# Patient Record
Sex: Male | Born: 1975 | Race: Black or African American | Hispanic: No | Marital: Single | Smoking: Never smoker
Health system: Southern US, Community
[De-identification: ages and names within clinical notes are randomized; demographics above are authoritative.]

## PROBLEM LIST (undated history)

## (undated) DIAGNOSIS — G43009 Migraine without aura, not intractable, without status migrainosus: Secondary | ICD-10-CM

## (undated) DIAGNOSIS — F411 Generalized anxiety disorder: Secondary | ICD-10-CM

## (undated) DIAGNOSIS — J309 Allergic rhinitis, unspecified: Secondary | ICD-10-CM

## (undated) DIAGNOSIS — Z87898 Personal history of other specified conditions: Secondary | ICD-10-CM

## (undated) DIAGNOSIS — E785 Hyperlipidemia, unspecified: Secondary | ICD-10-CM

## (undated) HISTORY — DX: Personal history of other specified conditions: Z87.898

## (undated) HISTORY — DX: Allergic rhinitis, unspecified: J30.9

## (undated) HISTORY — DX: Migraine without aura, not intractable, without status migrainosus: G43.009

## (undated) HISTORY — DX: Morbid (severe) obesity due to excess calories: E66.01

## (undated) HISTORY — DX: Generalized anxiety disorder: F41.1

## (undated) HISTORY — DX: Hyperlipidemia, unspecified: E78.5

---

## 2002-06-09 ENCOUNTER — Emergency Department (HOSPITAL_COMMUNITY): Admission: EM | Admit: 2002-06-09 | Discharge: 2002-06-09 | Payer: Self-pay | Admitting: Emergency Medicine

## 2005-11-20 ENCOUNTER — Ambulatory Visit: Payer: Self-pay | Admitting: Internal Medicine

## 2005-12-13 ENCOUNTER — Ambulatory Visit: Payer: Self-pay | Admitting: *Deleted

## 2005-12-13 ENCOUNTER — Encounter (HOSPITAL_COMMUNITY): Admission: RE | Admit: 2005-12-13 | Discharge: 2006-03-13 | Payer: Self-pay | Admitting: Internal Medicine

## 2006-01-31 ENCOUNTER — Ambulatory Visit: Payer: Self-pay | Admitting: Cardiology

## 2006-02-12 ENCOUNTER — Encounter: Payer: Self-pay | Admitting: Cardiovascular Disease

## 2006-02-12 ENCOUNTER — Ambulatory Visit: Payer: Self-pay

## 2006-09-11 ENCOUNTER — Ambulatory Visit: Payer: Self-pay | Admitting: Internal Medicine

## 2007-02-14 ENCOUNTER — Encounter: Admission: RE | Admit: 2007-02-14 | Discharge: 2007-02-14 | Payer: Self-pay | Admitting: Occupational Medicine

## 2007-09-10 ENCOUNTER — Ambulatory Visit: Payer: Self-pay | Admitting: Internal Medicine

## 2007-10-09 ENCOUNTER — Ambulatory Visit: Payer: Self-pay | Admitting: Internal Medicine

## 2007-10-09 LAB — CONVERTED CEMR LAB
AST: 19 units/L (ref 0–37)
Albumin: 3.6 g/dL (ref 3.5–5.2)
BUN: 6 mg/dL (ref 6–23)
Basophils Absolute: 0.1 10*3/uL (ref 0.0–0.1)
Bilirubin Urine: NEGATIVE
Calcium: 9.2 mg/dL (ref 8.4–10.5)
Chloride: 107 meq/L (ref 96–112)
Cholesterol: 169 mg/dL (ref 0–200)
Eosinophils Absolute: 0.1 10*3/uL (ref 0.0–0.6)
GFR calc non Af Amer: 165 mL/min
HDL: 52.7 mg/dL (ref >39.0)
Hemoglobin, Urine: NEGATIVE
Hgb A1c MFr Bld: 5.9 % (ref 4.6–6.0)
Ketones, ur: NEGATIVE mg/dL
LDL Cholesterol: 110 mg/dL — ABNORMAL HIGH (ref 0–99)
MCHC: 34.3 g/dL (ref 30.0–36.0)
MCV: 86.6 fL (ref 78.0–100.0)
Monocytes Relative: 10.2 % (ref 3.0–11.0)
Neutrophils Relative %: 54 % (ref 43.0–77.0)
Platelets: 253 10*3/uL (ref 150–400)
RBC: 4.43 M/uL (ref 4.22–5.81)
Sodium: 141 meq/L (ref 135–145)
Total CHOL/HDL Ratio: 3.2
Urine Glucose: NEGATIVE mg/dL
VLDL: 6 mg/dL (ref 0–40)

## 2007-10-14 DIAGNOSIS — Z87898 Personal history of other specified conditions: Secondary | ICD-10-CM

## 2007-10-14 DIAGNOSIS — Z9189 Other specified personal risk factors, not elsewhere classified: Secondary | ICD-10-CM | POA: Insufficient documentation

## 2007-10-14 HISTORY — DX: Personal history of other specified conditions: Z87.898

## 2007-10-15 ENCOUNTER — Ambulatory Visit: Payer: Self-pay | Admitting: Internal Medicine

## 2008-01-08 ENCOUNTER — Ambulatory Visit: Payer: Self-pay | Admitting: Internal Medicine

## 2008-05-06 ENCOUNTER — Ambulatory Visit: Payer: Self-pay | Admitting: Internal Medicine

## 2008-05-06 ENCOUNTER — Telehealth: Payer: Self-pay | Admitting: Internal Medicine

## 2008-05-06 DIAGNOSIS — M25579 Pain in unspecified ankle and joints of unspecified foot: Secondary | ICD-10-CM

## 2008-11-22 ENCOUNTER — Ambulatory Visit: Payer: Self-pay | Admitting: Internal Medicine

## 2008-11-22 DIAGNOSIS — J019 Acute sinusitis, unspecified: Secondary | ICD-10-CM | POA: Insufficient documentation

## 2008-11-22 LAB — CONVERTED CEMR LAB
Basophils Absolute: 0 10*3/uL (ref 0.0–0.1)
Bilirubin Urine: NEGATIVE
Bilirubin, Direct: 0.2 mg/dL (ref 0.0–0.3)
Calcium: 9.5 mg/dL (ref 8.4–10.5)
GFR calc Af Amer: 166 mL/min
HCT: 42.6 % (ref 39.0–52.0)
HDL: 54 mg/dL (ref >39.0)
Hemoglobin: 14.4 g/dL (ref 13.0–17.0)
Leukocytes, UA: NEGATIVE
MCHC: 33.9 g/dL (ref 30.0–36.0)
MCV: 88.4 fL (ref 78.0–100.0)
Monocytes Absolute: 0.9 10*3/uL (ref 0.1–1.0)
Neutro Abs: 5.8 10*3/uL (ref 1.4–7.7)
RDW: 13 % (ref 11.5–14.6)
Sodium: 137 meq/L (ref 135–145)
TSH: 3.78 microintl units/mL (ref 0.35–5.50)
Total Bilirubin: 1.4 mg/dL — ABNORMAL HIGH (ref 0.3–1.2)
Total CHOL/HDL Ratio: 3.2
Total Protein: 8.5 g/dL — ABNORMAL HIGH (ref 6.0–8.3)
Triglycerides: 42 mg/dL (ref 0–149)
pH: 7 (ref 5.0–8.0)

## 2010-01-19 ENCOUNTER — Ambulatory Visit: Payer: Self-pay | Admitting: Internal Medicine

## 2010-01-19 ENCOUNTER — Encounter (INDEPENDENT_AMBULATORY_CARE_PROVIDER_SITE_OTHER): Payer: Self-pay | Admitting: *Deleted

## 2010-01-20 LAB — CONVERTED CEMR LAB
ALT: 27 units/L (ref 0–53)
AST: 24 units/L (ref 0–37)
Albumin: 3.8 g/dL (ref 3.5–5.2)
Basophils Absolute: 0 10*3/uL (ref 0.0–0.1)
Basophils Relative: 0.4 % (ref 0.0–3.0)
CO2: 30 meq/L (ref 19–32)
Cholesterol: 167 mg/dL (ref 0–200)
GFR calc non Af Amer: 196.57 mL/min (ref >60)
Glucose, Bld: 89 mg/dL (ref 70–99)
HCT: 41.3 % (ref 39.0–52.0)
Hemoglobin: 13.5 g/dL (ref 13.0–17.0)
Leukocytes, UA: NEGATIVE
Lymphs Abs: 1.8 10*3/uL (ref 0.7–4.0)
Monocytes Relative: 16.5 % — ABNORMAL HIGH (ref 3.0–12.0)
Neutro Abs: 0.8 10*3/uL — ABNORMAL LOW (ref 1.4–7.7)
Nitrite: NEGATIVE
Potassium: 4.1 meq/L (ref 3.5–5.1)
RBC: 4.59 M/uL (ref 4.22–5.81)
RDW: 13.1 % (ref 11.5–14.6)
Sodium: 138 meq/L (ref 135–145)
Specific Gravity, Urine: 1.025 (ref 1.000–1.030)
TSH: 2.93 microintl units/mL (ref 0.35–5.50)
Total Protein, Urine: NEGATIVE mg/dL
Total Protein: 8.4 g/dL — ABNORMAL HIGH (ref 6.0–8.3)
VLDL: 13.4 mg/dL (ref 0.0–40.0)
pH: 6 (ref 5.0–8.0)

## 2010-04-26 ENCOUNTER — Ambulatory Visit: Payer: Self-pay | Admitting: Internal Medicine

## 2010-04-26 DIAGNOSIS — J309 Allergic rhinitis, unspecified: Secondary | ICD-10-CM

## 2010-04-26 DIAGNOSIS — G43009 Migraine without aura, not intractable, without status migrainosus: Secondary | ICD-10-CM | POA: Insufficient documentation

## 2010-04-26 HISTORY — DX: Migraine without aura, not intractable, without status migrainosus: G43.009

## 2010-04-26 HISTORY — DX: Allergic rhinitis, unspecified: J30.9

## 2011-01-06 ENCOUNTER — Encounter: Payer: Self-pay | Admitting: Internal Medicine

## 2011-01-18 NOTE — Letter (Signed)
Summary: Out of Work  [facility]  [address] [place], [place] [address]   Phone: [phone]  Fax: [phone]    January 19, 2010   Employee:  ANUSH WIEDEMAN    To Whom It May Concern:   For Medical reasons, please excuse the above named employee from work for the following dates:  Start:   01/19/2010  End:   01/22/2010  If you need additional information, please feel free to contact our office.         Sincerely,    Dr Oliver Barre

## 2011-01-18 NOTE — Letter (Signed)
Summary: Out of Work  [facility]  [address] [place], [place] [address]   Phone: [phone]  Fax: [phone]    Apr 26, 2010   Employee:  FAIZAN GERACI Wernli    To Whom It May Concern:   For Medical reasons, please excuse the above named employee from work for the following dates:  Start:   Apr 25, 2010  End:   Apr 26, 2010   -  to return to work Apr 27, 2010  If you need additional information, please feel free to contact our office.         Sincerely,    Corwin Levins MD

## 2011-01-18 NOTE — Assessment & Plan Note (Signed)
Summary: headaches/cd   Vital Signs:  Patient profile:   35 year old male Height:      70 inches Weight:      453 pounds BMI:     65.23 O2 Sat:      98 % on Room air Temp:     97.7 degrees F oral Pulse rate:   70 / minute BP sitting:   108 / 70  (left arm) Cuff size:   large  Vitals Entered By: Bill Salinas CMA (Apr 26, 2010 2:10 PM)  O2 Flow:  Room air CC: pt here with complaint of headaches with sensitivity to light and sound/ ab   Primary Care Provider:  Corwin Levins MD  CC:  pt here with complaint of headaches with sensitivity to light and sound/ ab.  History of Present Illness: here with onset x 2 yrs recurrening headaches describes as usually left sided, throbbing, with nuausea and photophobia, lasting several hours per episode, and usually better with execdring migraine.  Similar to headache he used to have as teen.  This wk had another headache Monday that did not seem to be more severe (but still only mod), but also with 3 days low grade fever, ST, general weakness, but no cough and Pt denies CP, sob, doe, wheezing, orthopnea, pnd, worsening LE edema, palps, dizziness or syncope   Pt denies new neuro symptoms such as headache, facial or extremity weakness  Also has 1-2 mo nasal allergy symtpom with clearish d/c .    Preventive Screening-Counseling & Management      Drug Use:  no.    Problems Prior to Update: 1)  Allergic Rhinitis  (ICD-477.9) 2)  Common Migraine  (ICD-346.10) 3)  Pharyngitis-acute  (ICD-462) 4)  Sinusitis- Acute-nos  (ICD-461.9) 5)  Preventive Health Care  (ICD-V70.0) 6)  Ankle Pain, Left  (ICD-719.47) 7)  Morbid Obesity, Hx of  (ICD-V13.8) 8)  Chest Pain, Atypical, Hx of  (ICD-V15.89)  Medications Prior to Update: 1)  Clarithromycin 500 Mg Tabs (Clarithromycin) .Marland Kitchen.. 1po Two Times A Day  Current Medications (verified): 1)  Sumatriptan Succinate 100 Mg Tabs (Sumatriptan Succinate) .Marland Kitchen.. 1 By Mouth Once Daily As Needed Migraine 2)  Azithromycin 250  Mg Tabs (Azithromycin) .... 2po Qd For 1 Day, Then 1po Qd For 4days, Then Stop 3)  Fexofenadine Hcl 180 Mg Tabs (Fexofenadine Hcl) .Marland Kitchen.. 1 By Mouth Once Daily As Needed Allergies  Allergies (verified): No Known Drug Allergies  Past History:  Past Surgical History: Last updated: 11/22/2008 Denies surgical history  Social History: Last updated: 04/26/2010 He does not drink or smoke.  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 He does not drink or smoke.  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  , mild ill  Head:  normocephalic and atraumatic.   Eyes:  vision grossly intact, pupils equal, and pupils round.   Ears:  bilat tm's mild red, sinus nontender Nose:  nasal dischargemucosal pallor and mucosal edema.   Mouth:  pharyngeal erythema and fair dentition.   Neck:  supple and cervical lymphadenopathy.   Lungs:  normal  respiratory effort and normal breath sounds.   Heart:  normal rate and regular rhythm.   Extremities:  no edema, no erythema  Neurologic:  cranial nerves II-XII intact, strength normal in all extremities, gait normal, and DTRs symmetrical and normal.     Impression & Recommendations:  Problem # 1:  COMMON MIGRAINE (ICD-346.10)  His updated medication list for this problem includes:    Sumatriptan Succinate 100 Mg Tabs (Sumatriptan succinate) .Marland Kitchen... 1 by mouth once daily as needed migraine treat as above, f/u any worsening signs or symptoms   Problem # 2:  PHARYNGITIS-ACUTE (ICD-462)  The following medications were removed from the medication list:     Clarithromycin 500 Mg Tabs (Clarithromycin) .Marland Kitchen... 1po two times a day His updated medication list for this problem includes:    Azithromycin 250 Mg Tabs (Azithromycin) .Marland Kitchen... 2po qd for 1 day, then 1po qd for 4days, then stop treat as above, f/u any worsening signs or symptoms   Problem # 3:  ALLERGIC RHINITIS (ICD-477.9)  His updated medication list for this problem includes:    Fexofenadine Hcl 180 Mg Tabs (Fexofenadine hcl) .Marland Kitchen... 1 by mouth once daily as needed allergies treat as above, f/u any worsening signs or symptoms   Complete Medication List: 1)  Sumatriptan Succinate 100 Mg Tabs (Sumatriptan succinate) .Marland Kitchen.. 1 by mouth once daily as needed migraine 2)  Azithromycin 250 Mg Tabs (Azithromycin) .... 2po qd for 1 day, then 1po qd for 4days, then stop 3)  Fexofenadine Hcl 180 Mg Tabs (Fexofenadine hcl) .Marland Kitchen.. 1 by mouth once daily as needed allergies  Patient Instructions: 1)  Please take all new medications as prescribed 2)  Continue all previous medications as before this visit  3)  you can also take Excedrin Migraine for the milder headaches 4)  Please call if the headaches become more frequent or severe, as you may want to be [facility] 5)  Please schedule a follow-up appointment as needed. Prescriptions: FEXOFENADINE HCL 180 MG TABS (FEXOFENADINE HCL) 1 by mouth once daily as needed allergies  #30 x 11   Entered and Authorized by:   Corwin Levins MD   Signed by:   Corwin Levins MD on 04/26/2010   Method used:   Print then Give to Patient   RxID:   [id number] AZITHROMYCIN 250 MG TABS (AZITHROMYCIN) 2po qd for 1 day, then 1po qd for 4days, then stop  #6 x 1   Entered and Authorized by:   Corwin Levins MD   Signed by:   Corwin Levins MD on 04/26/2010   Method used:   Print then Give to Patient   RxID:   [id number] SUMATRIPTAN SUCCINATE 100 MG TABS (SUMATRIPTAN SUCCINATE) 1 by mouth once daily as needed migraine  #9 x 11   Entered and  Authorized by:   Corwin Levins MD   Signed by:   Corwin Levins MD on 04/26/2010   Method used:   Print then Give to Patient   RxID:   [id number]

## 2011-01-18 NOTE — Assessment & Plan Note (Signed)
Summary: COLD/ CONGESTION/ COUGH/NWS   Vital Signs:  Patient profile:   35 year old male Height:      70 inches Weight:      459 pounds BMI:     66.10 O2 Sat:      98 % on Room air Temp:     98 degrees F oral Pulse rate:   75 / minute BP sitting:   112 / 82  (left arm) Cuff size:   large  Vitals Entered ByMarland Kitchen Zella Ball Ewing (January 19, 2010 2:04 PM)  O2 Flow:  Room air  CC: congestion,cough, sneeezing/RE   Primary Care Provider:  Corwin Levins MD  CC:  congestion, cough, and sneeezing/RE.  History of Present Illness: here for wellness, incidently with 3 days facial pain, pressure, fever and greenish d/c;  some mild ST, but Pt denies CP, sob, doe, wheezing, orthopnea, pnd, worsening LE edema, palps, dizziness or syncope   Pt denies new neuro symptoms such as headache, facial or extremity weakness     Problems Prior to Update: 1)  Sinusitis- Acute-nos  (ICD-461.9) 2)  Preventive Health Care  (ICD-V70.0) 3)  Ankle Pain, Left  (ICD-719.47) 4)  Morbid Obesity, Hx of  (ICD-V13.8) 5)  Chest Pain, Atypical, Hx of  (ICD-V15.89)  Medications Prior to Update: 1)  Azithromycin 250 Mg Tabs (Azithromycin) .... 2po Qd For 1 Day, Then 1po Qd For 4days, Then Stop  Current Medications (verified): 1)  Clarithromycin 500 Mg Tabs (Clarithromycin) .Marland Kitchen.. 1po Two Times A Day  Allergies (verified): No Known Drug Allergies  Past History:  Past Medical History: Last updated: 05/06/2008 Morbid obesity  Past Surgical History: Last updated: 11/22/2008 Denies surgical history  Family History: Last updated: 05/06/2008 Mother is age 46, has coronary artery disease.  Father is age 16, has hyperlipidemia and hypertension, status post coronary artery bypass grafting.  There is a family history of diabetes in his grandparents and aunts and uncles.  Mother is also morbidly obese, as well as other extended family members.  Patient recently had a brother that is age 67 die of sudden death.  Death  certificate noted possible heart disease.  Patient has 2 other brothers in relatively good health and one sister known to have some manner of thyroid disease.  Social History: Last updated: 11/22/2008 He does not drink or smoke.  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         all otherwise negative per pt   Physical Exam  General:  alert and overweight-appearing.  , mild ill  Head:  normocephalic and atraumatic.   Eyes:  vision grossly intact, pupils equal, and pupils round.   Ears:  bilat tm's red , sinus tender bilat Nose:  nasal dischargemucosal pallor and mucosal erythema.   Mouth:  pharyngeal erythema and fair dentition.   Neck:  supple and no masses.   Lungs:  normal respiratory effort and normal breath sounds.   Heart:  normal rate and regular rhythm.   Abdomen:  soft, non-tender, and normal bowel sounds.   Msk:  no joint tenderness and no joint swelling.   Extremities:  no edema, no erythema  Neurologic:  cranial nerves II-XII intact and strength normal in all extremities.     Impression & Recommendations:  Problem # 1:  Preventive Health Care (ICD-V70.0)  Overall doing well, age appropriate education and counseling updated and referral for appropriate preventive services done unless declined, immunizations up to date or declined, diet counseling done if overweight, urged to quit smoking if smokes , most recent labs reviewed and current ordered if  appropriate, ecg reviewed or declined (interpretation per ECG scanned in the EMR if done); information regarding Medicare Prevention requirements given if appropriate   Orders: TLB-BMP (Basic Metabolic Panel-BMET) (80048-METABOL) TLB-CBC Platelet - w/Differential (85025-CBCD) TLB-Hepatic/Liver Function Pnl (80076-HEPATIC) TLB-Lipid Panel (80061-LIPID) TLB-TSH (Thyroid Stimulating Hormone) (84443-TSH) TLB-Udip ONLY (81003-UDIP) Prescription Created Electronically [phone])  Problem # 2:  SINUSITIS- ACUTE-NOS (ICD-461.9)  His updated medication list for this problem includes:    Clarithromycin 500 Mg Tabs (Clarithromycin) .Marland Kitchen... 1po two times a day treat as above, f/u any worsening signs or symptoms   Complete Medication List: 1)  Clarithromycin 500 Mg Tabs (Clarithromycin) .Marland Kitchen.. 1po two times a day  Other Orders: Tdap => 11yrs IM (98119) Admin 1st Vaccine (14782)  Patient Instructions: 1)  you had the tetanus shot today 2)  Please take all new medications as prescribed 3)  Continue all previous medications as before this visit 4)  You can also use Mucinex D OTC or it's generic for congestion  5)  Please go to the Lab in the basement for your blood and/or urine tests today 6)  You are given the work note today 7)  Please schedule a follow-up appointment in 1 year or sooner if needed Prescriptions: CLARITHROMYCIN 500 MG TABS (CLARITHROMYCIN) 1po two times a day  #20 x 0   Entered and Authorized by:   Corwin Levins MD   Signed by:   Corwin Levins MD on 01/19/2010   Method used:   Print then Give to Patient   RxID:   [id number]    Immunizations Administered:  Tetanus Vaccine:    Vaccine Type: Tdap    Site: right deltoid    Mfr: GlaxoSmithKline    Dose: 0.5 ml    Route: IM    Given by: Robin Ewing    Exp. Date: 02/11/2012    Lot #: [id number]    VIS given: 11/04/07 version given January 19, 2010.

## 2011-01-18 NOTE — Letter (Signed)
Summary: Out of Work  [facility]  [address] [place], [place] [address]   Phone: [phone]  Fax: [phone]    January 19, 2010   Employee:  Francisco Hoffman    To Whom It May Concern:   For Medical reasons, please excuse the above named employee from work for the following dates:  Start: 01/16/2010    End: 01/23/2010    If you need additional information, please feel free to contact our office.         Sincerely,    Dr Oliver Barre

## 2011-02-12 ENCOUNTER — Ambulatory Visit: Payer: Self-pay | Admitting: Internal Medicine

## 2011-02-22 ENCOUNTER — Encounter: Payer: Self-pay | Admitting: Internal Medicine

## 2011-02-22 ENCOUNTER — Ambulatory Visit (INDEPENDENT_AMBULATORY_CARE_PROVIDER_SITE_OTHER): Payer: BC Managed Care – PPO | Admitting: Internal Medicine

## 2011-02-22 DIAGNOSIS — A088 Other specified intestinal infections: Secondary | ICD-10-CM | POA: Insufficient documentation

## 2011-02-22 DIAGNOSIS — G43009 Migraine without aura, not intractable, without status migrainosus: Secondary | ICD-10-CM

## 2011-02-22 DIAGNOSIS — F411 Generalized anxiety disorder: Secondary | ICD-10-CM

## 2011-02-22 HISTORY — DX: Generalized anxiety disorder: F41.1

## 2011-02-27 NOTE — Letter (Signed)
Summary: Out of Work  [facility]  [address] [place], [place] [address]   Phone: [phone]  Fax: [phone]    February 22, 2011   Employee:  REBEKAH SPRINKLE    To Whom It May Concern:   For Medical reasons, please excuse the above named employee from work for the following dates:  Start:   Feb 21, 2011  End:   Feb 25, 2011    -  to return to work Feb 26, 2011 without restriction  If you need additional information, please feel free to contact our office.         Sincerely,    Corwin Levins MD

## 2011-02-27 NOTE — Assessment & Plan Note (Signed)
Summary: diarrhea and vomitting for 2 days/lb   Vital Signs:  Patient profile:   35 year old male Height:      70 inches Weight:      435.25 pounds BMI:     62.68 O2 Sat:      97 % on Room air Temp:     98.5 degrees F oral Pulse rate:   81 / minute BP sitting:   118 / 76  (left arm) Cuff size:   large  Vitals Entered By: Margaret Pyle, CMA (February 22, 2011 4:52 PM)  O2 Flow:  Room air CC: abd pain, nausea and diarrhea x 2 days   Primary Care Provider:  Corwin Levins MD  CC:  abd pain and nausea and diarrhea x 2 days.  History of Present Illness: here to f/u with symtpoms of fever, general weakness and malaise, n/v crampy abd pains and mult loose stools without blood over the past 2 days;  Pt denies CP, worsening sob, doe, wheezing, orthopnea, pnd, worsening LE edema, palps, dizziness or syncope  No gu symtpoms such as dysuria, freq, urgency. No recent wt loss, night sweats, loss of appetite or other constitutional symptoms . 2 other family members ill with the same symptoms symtoms now resolved.  Pt denies polydipsia, polyuria,  Overall good compliance with meds, trying to follow low chol, DM diet, wt stable, little excercise however .  Denies worsening depressive symptoms, suicidal ideation, or panic, though has some ongoing anxiety, though not worse recently.  Did miss work for 3 days in late feb due to migraine, but this is very uncommon recently,  declines need for change in tx , or trial SSRI or ther anxiety tx , or counseling  Problems Prior to Update: 1)  Viral Gastroenteritis  (ICD-008.8) 2)  Allergic Rhinitis  (ICD-477.9) 3)  Common Migraine  (ICD-346.10) 4)  Sinusitis- Acute-nos  (ICD-461.9) 5)  Preventive Health Care  (ICD-V70.0) 6)  Ankle Pain, Left  (ICD-719.47) 7)  Morbid Obesity, Hx of  (ICD-V13.8) 8)  Chest Pain, Atypical, Hx of  (ICD-V15.89)  Medications Prior to Update: 1)  Sumatriptan Succinate 100 Mg Tabs (Sumatriptan Succinate) .Marland Kitchen.. 1 By Mouth Once  Daily As Needed Migraine 2)  Azithromycin 250 Mg Tabs (Azithromycin) .... 2po Qd For 1 Day, Then 1po Qd For 4days, Then Stop 3)  Fexofenadine Hcl 180 Mg Tabs (Fexofenadine Hcl) .Marland Kitchen.. 1 By Mouth Once Daily As Needed Allergies  Current Medications (verified): 1)  Sumatriptan Succinate 100 Mg Tabs (Sumatriptan Succinate) .Marland Kitchen.. 1 By Mouth Once Daily As Needed Migraine 2)  Fexofenadine Hcl 180 Mg Tabs (Fexofenadine Hcl) .Marland Kitchen.. 1 By Mouth Once Daily As Needed Allergies 3)  Promethazine Hcl 25 Mg Tabs (Promethazine Hcl) .Marland Kitchen.. 1 By Mouth Q 6 Hrs As Needed Nausea 4)  Lomotil 2.5-0.025 Mg Tabs (Diphenoxylate-Atropine) .Marland Kitchen.. 1po As Needed Loose Stool - Max 8 Tabs Per 24 Hrs  Allergies (verified): No Known Drug Allergies  Past History:  Past Surgical History: Last updated: 11/22/2008 Denies surgical history  Social History: Last updated: 04/26/2010 He does not drink or smoke.  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   Eyes:  vision grossly intact, pupils equal, and pupils round.   Ears:  bilat tm's mild red, sinus nontender Nose:  nasal dischargemucosal pallor and mucosal edema.   Mouth:  pharyngeal erythema and fair dentition.   Neck:  supple and cervical lymphadenopathy.   Lungs:  normal respiratory effort and normal breath sounds.   Heart:  normal rate and regular rhythm.   Abdomen:  soft and normal bowel sounds.  wtih mild diffuse abd tender without guarding or rebound Msk:  no joint tenderness and no joint swelling.   Extremities:  no edema, no erythema  Skin:  color normal and no rashes.   Psych:  not depressed appearing and slightly anxious.      Impression & Recommendations:  Problem # 1:  VIRAL GASTROENTERITIS (ICD-008.8) c/w noroviral illness - for symptomatic treatment, f/u any worsening symptoms but should resolve like family has improved  Problem # 2:  COMMON MIGRAINE (ICD-346.10)  His updated medication list for this problem includes:    Sumatriptan Succinate 100 Mg Tabs (Sumatriptan succinate) .Marland Kitchen... 1 by mouth once daily as needed migraine stable overall by hx and exam, ok to continue meds/tx as is   Problem # 3:  ANXIETY (ICD-300.00) stable overall by hx and exam, ok to continue meds/tx as is , declines further tx such as SSRI trial or counseling  Complete Medication List: 1)  Sumatriptan Succinate 100 Mg Tabs (Sumatriptan succinate) .Marland Kitchen.. 1 by mouth once daily as needed migraine 2)  Fexofenadine Hcl 180 Mg Tabs (Fexofenadine hcl) .Marland Kitchen.. 1 by mouth once daily as needed allergies 3)  Promethazine Hcl 25 Mg Tabs (Promethazine hcl) .Marland Kitchen.. 1 by mouth q 6 hrs as needed nausea 4)  Lomotil 2.5-0.025 Mg Tabs (Diphenoxylate-atropine) .Marland Kitchen.. 1po as needed loose stool - max 8 tabs per 24 hrs  Patient Instructions: 1)  Please take all new medications as prescribed 2)  Continue all previous medications as before this visit  3)  You are given the work note today 4)  Please schedule a follow-up appointment as needed. Prescriptions: LOMOTIL 2.5-0.025 MG TABS (DIPHENOXYLATE-ATROPINE) 1po as needed loose stool - max 8 tabs per 24 hrs  #40 x 0   Entered and Authorized by:   Corwin Levins MD   Signed by:   Corwin Levins MD on 02/22/2011   Method used:   Print then Give to Patient   RxID:   [id number] PROMETHAZINE HCL 25 MG TABS (PROMETHAZINE HCL) 1 by mouth q 6 hrs as needed nausea  #40 x 1   Entered and Authorized by:   Corwin Levins MD   Signed by:   Corwin Levins MD on 02/22/2011   Method used:   Print then Give to Patient   RxID:   [id number]    Orders Added: 1)  Est. Patient Level IV [32440]

## 2014-02-07 ENCOUNTER — Emergency Department (HOSPITAL_COMMUNITY)
Admission: EM | Admit: 2014-02-07 | Discharge: 2014-02-07 | Disposition: A | Discharge status: Home / Self Care | Payer: BC Managed Care – PPO | Attending: Emergency Medicine | Admitting: Emergency Medicine

## 2014-02-07 ENCOUNTER — Emergency Department (HOSPITAL_COMMUNITY): Payer: BC Managed Care – PPO

## 2014-02-07 ENCOUNTER — Encounter (HOSPITAL_COMMUNITY): Payer: Self-pay | Admitting: Emergency Medicine

## 2014-02-07 DIAGNOSIS — IMO0002 Reserved for concepts with insufficient information to code with codable children: Secondary | ICD-10-CM | POA: Insufficient documentation

## 2014-02-07 DIAGNOSIS — Y939 Activity, unspecified: Secondary | ICD-10-CM | POA: Insufficient documentation

## 2014-02-07 DIAGNOSIS — S93409A Sprain of unspecified ligament of unspecified ankle, initial encounter: Secondary | ICD-10-CM | POA: Insufficient documentation

## 2014-02-07 DIAGNOSIS — W010XXA Fall on same level from slipping, tripping and stumbling without subsequent striking against object, initial encounter: Secondary | ICD-10-CM | POA: Insufficient documentation

## 2014-02-07 DIAGNOSIS — Y929 Unspecified place or not applicable: Secondary | ICD-10-CM | POA: Insufficient documentation

## 2014-02-07 DIAGNOSIS — S8390XA Sprain of unspecified site of unspecified knee, initial encounter: Secondary | ICD-10-CM

## 2014-02-07 MED ORDER — IBUPROFEN 200 MG PO TABS
600.0000 mg | ORAL_TABLET | Freq: Once | ORAL | Status: AC
  Administered 2014-02-07: 600 mg via ORAL
  Filled 2014-02-07: qty 3

## 2014-02-07 MED ORDER — IBUPROFEN 600 MG PO TABS: 600.0000 mg | ORAL_TABLET | Freq: Four times a day (QID) | ORAL | Status: DC | PRN

## 2014-02-07 NOTE — ED Provider Notes (Signed)
CSN: [id number]     Arrival date & time 02/07/14  [id number] History   First MD Initiated Contact with Patient 02/07/14 [phone]     Chief Complaint  Patient presents with  . Ankle Injury  . Knee Pain     (Consider location/radiation/quality/duration/timing/severity/associated sxs/prior Treatment) HPI Comments: Patient presents with complaint of left knee and left ankle pain that began acutely yesterday when he slipped on ice on stairs. Patient complains of pain over the outside of the ankle along with swelling and pain on the inside of the knee. Patient has been ambulatory. He took ibuprofen yesterday. Patient denies other injuries. Onset of symptoms. Walking makes the pain worse. Nothing makes it better.  Patient is a 38 y.o. male presenting with lower extremity injury and knee pain. The history is provided by the patient.  Ankle Injury Associated symptoms include arthralgias and joint swelling. Pertinent negatives include no neck pain, numbness or weakness.  Knee Pain Associated symptoms: no back pain and no neck pain     History reviewed. No pertinent past medical history. History reviewed. No pertinent past surgical history. No family history on file. History  Substance Use Topics  . Smoking status: Never Smoker   . Smokeless tobacco: Not on file  . Alcohol Use: No    Review of Systems  Constitutional: Negative for activity change.  Musculoskeletal: Positive for arthralgias and joint swelling. Negative for back pain, gait problem and neck pain.  Skin: Negative for wound.  Neurological: Negative for weakness and numbness.      Allergies  Shrimp  Home Medications   Current Outpatient Rx  Name  Route  Sig  Dispense  Refill  . ibuprofen (ADVIL,MOTRIN) 200 MG tablet   Oral   Take 200 mg by mouth every 6 (six) hours as needed for mild pain.          BP 151/121  Pulse 99  Temp(Src) 97.9 F (36.6 C) (Oral)  Resp 16  Ht 5\' 11"  (1.803 m)  Wt 430 lb (195.047 kg)  BMI 60.00  kg/m2  SpO2 98% Physical Exam  Vitals reviewed. Constitutional: He appears well-developed and well-nourished.  Morbidly obese  HENT:  Head: Normocephalic and atraumatic.  Eyes: Conjunctivae are normal.  Neck: Normal range of motion. Neck supple.  Cardiovascular:  Pulses:      Dorsalis pedis pulses are 2+ on the right side, and 2+ on the left side.       Posterior tibial pulses are 2+ on the right side, and 2+ on the left side.  Pulmonary/Chest: No respiratory distress.  Musculoskeletal: He exhibits edema and tenderness.       Left hip: Normal.       Left knee: He exhibits normal range of motion, no swelling and no effusion. Tenderness found. Medial joint line tenderness noted.       Left ankle: He exhibits decreased range of motion and swelling. He exhibits no deformity and normal pulse. Tenderness. Lateral malleolus tenderness found. No head of 5th metatarsal and no proximal fibula tenderness found. Achilles tendon normal.       Left lower leg: Normal.       Left foot: Normal.  Patient complains of pain with palpation of the medial/lateral right/left ankle. He denies pain with palpation over the fibular head of the affected side. She denies pain in the hip of the affected side.  Neurological: He is alert.  Distal motor, sensation, and vascular intact.  Skin: Skin is warm and dry.  Psychiatric: He  has a normal mood and affect.    ED Course  Procedures (including critical care time) Labs Review Labs Reviewed - No data to display Imaging Review Dg Knee Complete 4 Views Left  02/07/2014   CLINICAL DATA:  Left knee injury and pain.  EXAM: LEFT KNEE - COMPLETE 4+ VIEW  COMPARISON:  None.  FINDINGS: There is no evidence of fracture, dislocation, or joint effusion. Mild degenerative spurring is seen involving the patella and lateral compartment. No evidence of joint space narrowing. No other significant bone abnormality identified. Soft tissues are unremarkable.  IMPRESSION: No acute  findings.  Early degenerative spurring.   Electronically Signed   By: Myles Rosenthal M.D.   On: 02/07/2014 10:00    EKG Interpretation   None      10:15 AM Patient seen and examined. Work-up initiated. Medications ordered.   Vital signs reviewed and are as follows: Filed Vitals:   02/07/14 0841  BP: 151/121  Pulse: 99  Temp: 97.9 F (36.6 C)  Resp: 16   10:24 AM X-rays neg. ACE wrap ordered.   Patient was counseled on RICE protocol and told to rest injury, use ice for no longer than 15 minutes every hour, compress the area, and elevate above the level of their heart as much as possible to reduce swelling. Questions answered. Patient verbalized understanding.    Encouraged orthopedic followup if pain not improved in one week.    MDM   Final diagnoses:  Ankle sprain  Knee sprain   Patient with ankle and knee pain after injury. X-rays are negative. Will treat conservatively a sprain. Lower extremity is neurovascularly intact.    Renne Crigler, PA-C 02/07/14 1025

## 2014-02-07 NOTE — ED Notes (Signed)
Pt slipped on bottom stair when going down outside stairs yesterday afternoon.  Now c/o L ankle and knee pain.

## 2014-02-07 NOTE — Discharge Instructions (Signed)
Please read and follow all provided instructions.  Your diagnoses today include:  1. Ankle sprain   2. Knee sprain     Tests performed today include:  An x-ray of your ankle and knee - does NOT show any broken bones  Vital signs. See below for your results today.   Medications prescribed:   Ibuprofen (Motrin, Advil) - anti-inflammatory pain medication  Do not exceed 600mg  ibuprofen every 6 hours, take with food  You have been prescribed an anti-inflammatory medication or NSAID. Take with food. Take smallest effective dose for the shortest duration needed for your pain. Stop taking if you experience stomach pain or vomiting.   Take any prescribed medications only as directed.  Home care instructions:   Follow any educational materials contained in this packet  Follow R.I.C.E. Protocol:  R - rest your injury   I  - use ice on injury without applying directly to skin  C - compress injury with bandage or splint  E - elevate the injury as much as possible  Follow-up instructions: Please follow-up with your primary care provider or the provided orthopedic (bone specialist) if you continue to have significant pain or trouble walking in 1 week. In this case you may have a severe sprain that requires further care.   If you do not have a primary care doctor -- see below for referral information.   Return instructions:   Please return if your toes are numb or tingling, appear gray or blue, or you have severe pain (also elevate leg and loosen splint or wrap)  Please return to the Emergency Department if you experience worsening symptoms.   Please return if you have any other emergent concerns.  Additional Information:  Your vital signs today were: BP 151/121   Pulse 99   Temp(Src) 97.9 F (36.6 C) (Oral)   Resp 16   Ht 5\' 11"  (1.803 m)   Wt 430 lb (195.047 kg)   BMI 60.00 kg/m2   SpO2 98% If your blood pressure (BP) was elevated above 135/85 this visit, please have this  repeated by your doctor within one month. -------------- Your caregiver has diagnosed you as suffering from an ankle sprain. Ankle sprain occurs when the ligaments that hold the ankle joint together are stretched or torn. It may take 4 to 6 weeks to heal.  For Activity: If prescribed crutches, use crutches with non-weight bearing for the first few days. Then, you may walk on your ankle as the pain allows, or as instructed. Start gradually with weight bearing on the affected ankle. Once you can walk pain free, then try jogging. When you can run forwards, then you can try moving side-to-side. If you cannot walk without crutches in one week, you need a re-check. --------------

## 2014-02-08 NOTE — ED Provider Notes (Signed)
Medical screening examination/treatment/procedure(s) were performed by non-physician practitioner and as supervising physician I was immediately available for consultation/collaboration.  EKG Interpretation   None         Junius ArgyleForrest S Sueellen Kayes, MD 02/08/14 1147

## 2015-03-03 ENCOUNTER — Telehealth: Payer: Self-pay | Admitting: Internal Medicine

## 2015-03-03 NOTE — Telephone Encounter (Signed)
Ok, but please be aware that I do not prescribe long term "strong" pain medications (schedule II or higher)

## 2015-03-03 NOTE — Telephone Encounter (Signed)
°  Pt called in said that he would like to know if you would take him back on as a pt    [phone]

## 2015-03-07 NOTE — Telephone Encounter (Signed)
Left patient vm to call back to schedule appt  °

## 2015-03-25 ENCOUNTER — Encounter: Payer: Self-pay | Admitting: Internal Medicine

## 2015-03-25 ENCOUNTER — Other Ambulatory Visit (INDEPENDENT_AMBULATORY_CARE_PROVIDER_SITE_OTHER): Payer: BC Managed Care – PPO

## 2015-03-25 ENCOUNTER — Ambulatory Visit (INDEPENDENT_AMBULATORY_CARE_PROVIDER_SITE_OTHER): Payer: BC Managed Care – PPO | Admitting: Internal Medicine

## 2015-03-25 VITALS — BP 112/80 | HR 86 | Temp 98.4°F | Resp 18 | Ht 71.0 in | Wt >= 6400 oz

## 2015-03-25 DIAGNOSIS — Z0189 Encounter for other specified special examinations: Secondary | ICD-10-CM

## 2015-03-25 DIAGNOSIS — Z0000 Encounter for general adult medical examination without abnormal findings: Secondary | ICD-10-CM | POA: Diagnosis not present

## 2015-03-25 LAB — LIPID PANEL
CHOL/HDL RATIO: 4
Cholesterol: 170 mg/dL (ref 0–200)
HDL: 48.4 mg/dL (ref >39.00)
LDL CALC: 110 mg/dL — AB (ref 0–99)
NONHDL: 121.6
Triglycerides: 56 mg/dL (ref 0.0–149.0)
VLDL: 11.2 mg/dL (ref 0.0–40.0)

## 2015-03-25 LAB — CBC WITH DIFFERENTIAL/PLATELET
BASOS PCT: 0.5 % (ref 0.0–3.0)
Basophils Absolute: 0 10*3/uL (ref 0.0–0.1)
EOS ABS: 0.1 10*3/uL (ref 0.0–0.7)
EOS PCT: 0.7 % (ref 0.0–5.0)
HCT: 38.9 % — ABNORMAL LOW (ref 39.0–52.0)
HEMOGLOBIN: 13.1 g/dL (ref 13.0–17.0)
LYMPHS PCT: 27.2 % (ref 12.0–46.0)
Lymphs Abs: 2.4 10*3/uL (ref 0.7–4.0)
MCHC: 33.7 g/dL (ref 30.0–36.0)
MCV: 86 fl (ref 78.0–100.0)
MONOS PCT: 11.6 % (ref 3.0–12.0)
Monocytes Absolute: 1 10*3/uL (ref 0.1–1.0)
NEUTROS ABS: 5.2 10*3/uL (ref 1.4–7.7)
NEUTROS PCT: 60 % (ref 43.0–77.0)
Platelets: 264 10*3/uL (ref 150.0–400.0)
RBC: 4.52 Mil/uL (ref 4.22–5.81)
RDW: 14.3 % (ref 11.5–15.5)
WBC: 8.7 10*3/uL (ref 4.0–10.5)

## 2015-03-25 LAB — HEPATIC FUNCTION PANEL
ALBUMIN: 3.9 g/dL (ref 3.5–5.2)
ALK PHOS: 65 U/L (ref 39–117)
ALT: 14 U/L (ref 0–53)
AST: 13 U/L (ref 0–37)
Bilirubin, Direct: 0.2 mg/dL (ref 0.0–0.3)
TOTAL PROTEIN: 8.1 g/dL (ref 6.0–8.3)
Total Bilirubin: 0.8 mg/dL (ref 0.2–1.2)

## 2015-03-25 LAB — URINALYSIS, ROUTINE W REFLEX MICROSCOPIC
Bilirubin Urine: NEGATIVE
KETONES UR: NEGATIVE
LEUKOCYTES UA: NEGATIVE
NITRITE: NEGATIVE
PH: 5.5 (ref 5.0–8.0)
RBC / HPF: NONE SEEN (ref 0–?)
Total Protein, Urine: NEGATIVE
Urine Glucose: NEGATIVE
Urobilinogen, UA: 0.2 (ref 0.0–1.0)
WBC UA: NONE SEEN (ref 0–?)

## 2015-03-25 LAB — BASIC METABOLIC PANEL
BUN: 10 mg/dL (ref 6–23)
CALCIUM: 9.5 mg/dL (ref 8.4–10.5)
CO2: 27 meq/L (ref 19–32)
Chloride: 102 mEq/L (ref 96–112)
Creatinine, Ser: 0.62 mg/dL (ref 0.40–1.50)
GFR: 184.11 mL/min (ref >60.00)
GLUCOSE: 95 mg/dL (ref 70–99)
Potassium: 4.3 mEq/L (ref 3.5–5.1)
Sodium: 136 mEq/L (ref 135–145)

## 2015-03-25 LAB — TSH: TSH: 3.12 u[IU]/mL (ref 0.35–4.50)

## 2015-03-25 LAB — PSA: PSA: 0.3 ng/mL (ref 0.10–4.00)

## 2015-03-25 NOTE — Progress Notes (Signed)
Subjective:    Patient ID: Francisco Hoffman, male    DOB: 04/17/1976, 39 y.o.   MRN: [id number]  HPI  Here for wellness and f/u;  Overall doing ok;  Pt denies Chest pain, worsening SOB, DOE, wheezing, orthopnea, PND, worsening LE edema, palpitations, dizziness or syncope.  Pt denies neurological change such as new headache, facial or extremity weakness.  Pt denies polydipsia, polyuria, or low sugar symptoms. Pt states overall good compliance with treatment and medications, good tolerability, and has been trying to follow appropriate diet.  Pt denies worsening depressive symptoms, suicidal ideation or panic. No fever, night sweats, wt loss, loss of appetite, or other constitutional symptoms.  Pt states good ability with ADL's, has low fall risk, home safety reviewed and adequate, no other significant changes in hearing or vision, and only occasionally active with exercise. No current complaints Past Medical History  Diagnosis Date  . Morbid obesity   . ANXIETY 02/22/2011    Qualifier: Diagnosis of  By: Jonny Ruiz MD, Len Blalock   . ALLERGIC RHINITIS 04/26/2010    Qualifier: Diagnosis of  By: Jonny Ruiz MD, Len Blalock   . COMMON MIGRAINE 04/26/2010    Qualifier: Diagnosis of  By: Jonny Ruiz MD, Len Blalock   . MORBID OBESITY, HX OF 10/14/2007    Qualifier: Diagnosis of  By: Terrilee Croak CMA, Darlene     No past surgical history on file.  reports that he has never smoked. He does not have any smokeless tobacco history on file. He reports that he does not drink alcohol or use illicit drugs. family history includes Heart disease in his father; Hypertension in his father; Lung cancer in his father. Allergies  Allergen Reactions  . Shrimp [Shellfish Allergy] Nausea Only   Current Outpatient Prescriptions on File Prior to Visit  Medication Sig Dispense Refill  . ibuprofen (ADVIL,MOTRIN) 200 MG tablet Take 200 mg by mouth every 6 (six) hours as needed for mild pain.    Marland Kitchen ibuprofen (ADVIL,MOTRIN) 600 MG tablet Take 1 tablet (600 mg  total) by mouth every 6 (six) hours as needed. 20 tablet 0   No current facility-administered medications on file prior to visit.   Review of Systems Constitutional: Negative for increased diaphoresis, other activity, appetite or siginficant weight change other than noted HENT: Negative for worsening hearing loss, ear pain, facial swelling, mouth sores and neck stiffness.   Eyes: Negative for other worsening pain, redness or visual disturbance.  Respiratory: Negative for shortness of breath and wheezing  Cardiovascular: Negative for chest pain and palpitations.  Gastrointestinal: Negative for diarrhea, blood in stool, abdominal distention or other pain Genitourinary: Negative for hematuria, flank pain or change in urine volume.  Musculoskeletal: Negative for myalgias or other joint complaints.  Skin: Negative for color change and wound or drainage.  Neurological: Negative for syncope and numbness. other than noted Hematological: Negative for adenopathy. or other swelling Psychiatric/Behavioral: Negative for hallucinations, SI, self-injury, decreased concentration or other worsening agitation.      Objective:   Physical Exam BP 112/80 mmHg  Pulse 86  Temp(Src) 98.4 F (36.9 C) (Oral)  Resp 18  Ht  (1.803 m)  Wt 478 lb 1.9 oz (216.874 kg)  BMI 66.71 kg/m2  SpO2 98% VS noted,  Constitutional: Pt is oriented to person, place, and time. Appears well-developed and well-nourished - super morbid obese, in no significant distress Head: Normocephalic and atraumatic.  Right Ear: External ear normal.  Left Ear: External ear normal.  Nose: Nose normal.  Mouth/Throat: Oropharynx is clear and moist.  Eyes: Conjunctivae and EOM are normal. Pupils are equal, round, and reactive to light.  Neck: Normal range of motion. Neck supple. No JVD present. No tracheal deviation present or significant neck LA or mass Cardiovascular: Normal rate, regular rhythm, normal heart sounds and intact distal  pulses.   Pulmonary/Chest: Effort normal and breath sounds without rales or wheezing  Abdominal: Soft. Bowel sounds are normal. NT. No HSM  Musculoskeletal: Normal range of motion. Exhibits no edema.  Lymphadenopathy:  Has no cervical adenopathy.  Neurological: Pt is alert and oriented to person, place, and time. Pt has normal reflexes. No cranial nerve deficit. Motor grossly intact Skin: Skin is warm and dry. No rash noted.  Psychiatric:  Has normal mood and affect. Behavior is normal.     Assessment & Plan:

## 2015-03-25 NOTE — Progress Notes (Signed)
Pre visit review using our clinic review tool, if applicable. No additional management support is needed unless otherwise documented below in the visit note. 

## 2015-03-25 NOTE — Patient Instructions (Signed)
Please continue all other medications as before, and refills have been done if requested.  Please have the pharmacy call with any other refills you may need.  Please continue your efforts at being more active, low cholesterol diet, and weight control.  You are otherwise up to date with prevention measures today.  Please keep your appointments with your specialists as you may have planned  Please call if you would like a referral for consideration for Bariatric Surgury (gastric bypass)  Please go to the LAB in the Basement (turn left off the elevator) for the tests to be done today  You will be contacted by phone if any changes need to be made immediately.  Otherwise, you will receive a letter about your results with an explanation, but please check with MyChart first.  Please remember to sign up for MyChart if you have not done so, as this will be important to you in the future with finding out test results, communicating by private email, and scheduling acute appointments online when needed.  Please return in 1 year for your yearly visit, or sooner if needed, with Lab testing done 3-5 days before`

## 2015-03-25 NOTE — Assessment & Plan Note (Signed)

## 2015-05-21 ENCOUNTER — Ambulatory Visit (INDEPENDENT_AMBULATORY_CARE_PROVIDER_SITE_OTHER): Payer: Self-pay | Admitting: Emergency Medicine

## 2015-05-21 VITALS — BP 128/90 | HR 75 | Temp 98.3°F | Resp 20 | Ht 70.0 in | Wt >= 6400 oz

## 2015-05-21 DIAGNOSIS — Z021 Encounter for pre-employment examination: Secondary | ICD-10-CM

## 2015-05-21 DIAGNOSIS — Z029 Encounter for administrative examinations, unspecified: Secondary | ICD-10-CM

## 2015-05-21 NOTE — Progress Notes (Signed)
Subjective:  Patient ID: Francisco Hoffman, male    DOB: 05/30/1976  Age: 39 y.o. MRN: [id number]  CC: Employment Physical   HPI Francisco Hoffman presents  for DOT physical. He is morbidly obese. Extremities medication has no medical problems. Evidence to support obstructive sleep apnea  History Francisco Hoffman has a past medical history of Morbid obesity; ANXIETY (02/22/2011); ALLERGIC RHINITIS (04/26/2010); COMMON MIGRAINE (04/26/2010); and MORBID OBESITY, HX OF (10/14/2007).   He has no past surgical history on file.   His  family history includes Heart disease in his father; Hypertension in his father; Lung cancer in his father.  He   reports that he has never smoked. He does not have any smokeless tobacco history on file. He reports that he does not drink alcohol or use illicit drugs.  Outpatient Prescriptions Prior to Visit  Medication Sig Dispense Refill  . ibuprofen (ADVIL,MOTRIN) 200 MG tablet Take 200 mg by mouth every 6 (six) hours as needed for mild pain.    Marland Kitchen ibuprofen (ADVIL,MOTRIN) 600 MG tablet Take 1 tablet (600 mg total) by mouth every 6 (six) hours as needed. 20 tablet 0   No facility-administered medications prior to visit.    History   Social History  . Marital Status: Single    Spouse Name: N/A  . Number of Children: N/A  . Years of Education: N/A   Social History Main Topics  . Smoking status: Never Smoker   . Smokeless tobacco: Not on file  . Alcohol Use: No  . Drug Use: No  . Sexual Activity: Not on file   Other Topics Concern  . None   Social History Narrative     Review of Systems  Constitutional: Negative for fever, chills and appetite change.  HENT: Negative for congestion, ear pain, postnasal drip, sinus pressure and sore throat.   Eyes: Negative for pain and redness.  Respiratory: Negative for cough, shortness of breath and wheezing.   Cardiovascular: Negative for leg swelling.  Gastrointestinal: Negative for nausea, vomiting, abdominal  pain, diarrhea, constipation and blood in stool.  Endocrine: Negative for polyuria.  Genitourinary: Negative for dysuria, urgency, frequency and flank pain.  Musculoskeletal: Negative for gait problem.  Skin: Negative for rash.  Neurological: Negative for weakness and headaches.  Psychiatric/Behavioral: Negative for confusion and decreased concentration. The patient is not nervous/anxious.     Objective:  BP 128/90 mmHg  Pulse 75  Temp(Src) 98.3 F (36.8 C) (Oral)  Resp 20  Ht  (1.778 m)  Wt 485 lb 6 oz (220.165 kg)  BMI 69.64 kg/m2  SpO2 98%  Physical Exam  Constitutional: He is oriented to person, place, and time. He appears well-developed and well-nourished. No distress.  HENT:  Head: Normocephalic and atraumatic.  Right Ear: External ear normal.  Left Ear: External ear normal.  Nose: Nose normal.  Eyes: Conjunctivae and EOM are normal. Pupils are equal, round, and reactive to light. No scleral icterus.  Neck: Normal range of motion. Neck supple. No tracheal deviation present.  Cardiovascular: Normal rate, regular rhythm and normal heart sounds.   Pulmonary/Chest: Effort normal. No respiratory distress. He has no wheezes. He has no rales.  Abdominal: He exhibits no mass. There is no tenderness. There is no rebound and no guarding.  Musculoskeletal: He exhibits no edema.  Lymphadenopathy:    He has no cervical adenopathy.  Neurological: He is alert and oriented to person, place, and time. Coordination normal.  Skin: Skin is warm and dry. No rash  noted.  Psychiatric: He has a normal mood and affect. His behavior is normal.      Assessment & Plan:   Francisco Hoffman was seen today for employment physical.  Diagnoses and all orders for this visit:  Encounter for administrative examinations   I am having Francisco Hoffman maintain his ibuprofen.  No orders of the defined types were placed in this encounter.    Appropriate red flag conditions were discussed with the  patient as well as actions that should be taken.  Patient expressed his understanding.  Follow-up: Return if symptoms worsen or fail to improve.  Carmelina DaneAnderson, Francisco Linskey S, MD

## 2015-12-29 IMAGING — CR DG KNEE COMPLETE 4+V*L*
4 series · 4 of 4 positions shown · non-contrast
Comparison: None.

CLINICAL DATA: Left knee injury and pain.

EXAM:
LEFT KNEE - COMPLETE 4+ VIEW

[t knee ap left]
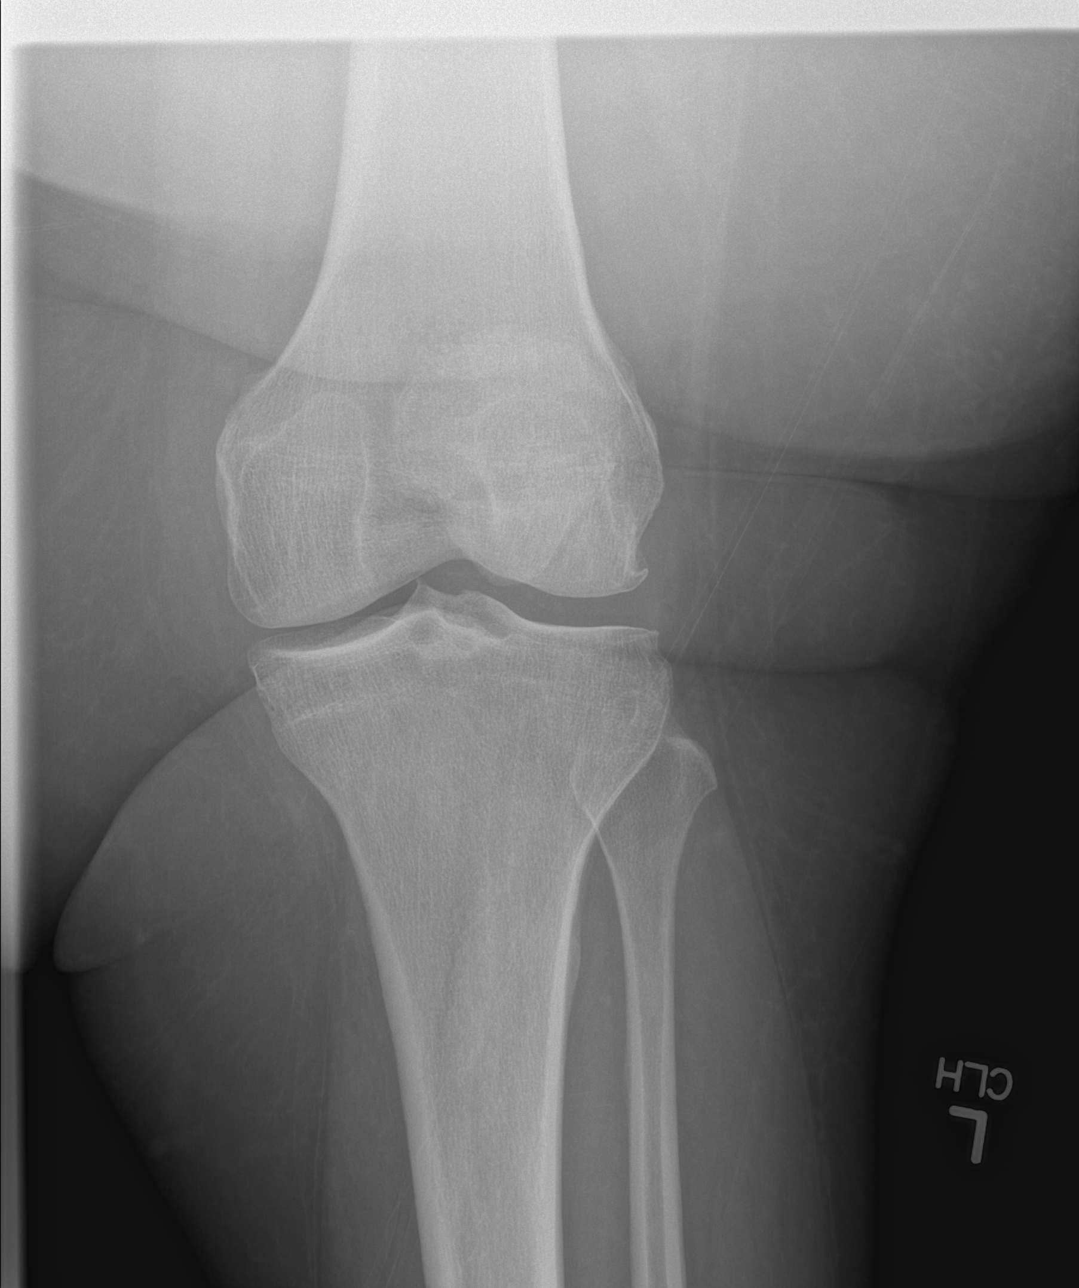

[t knee obl left (1 of 2)]
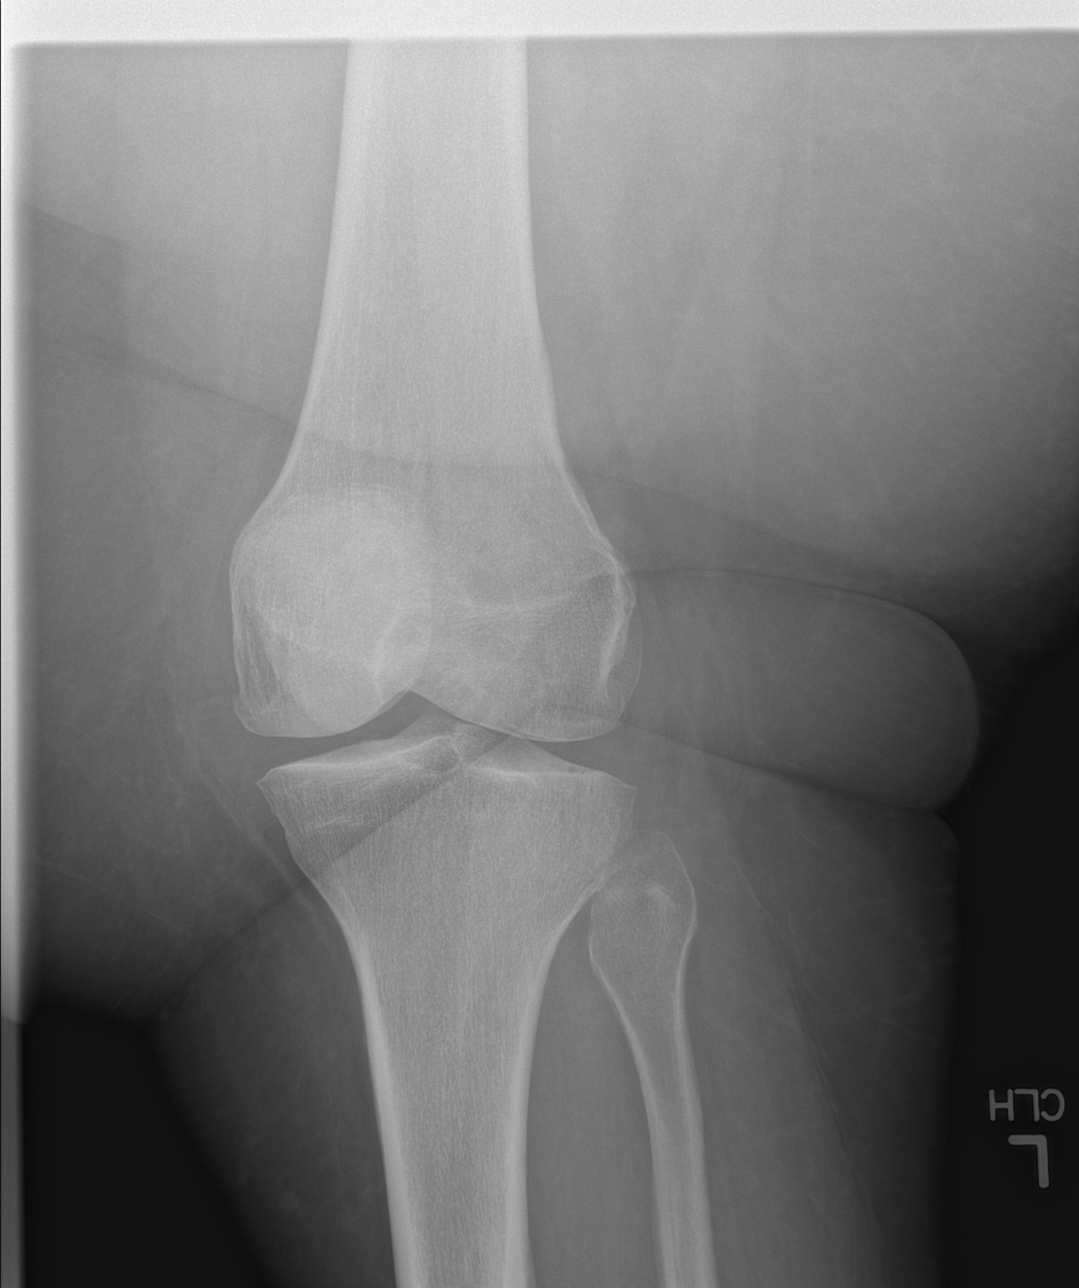

[t knee obl left (2 of 2)]
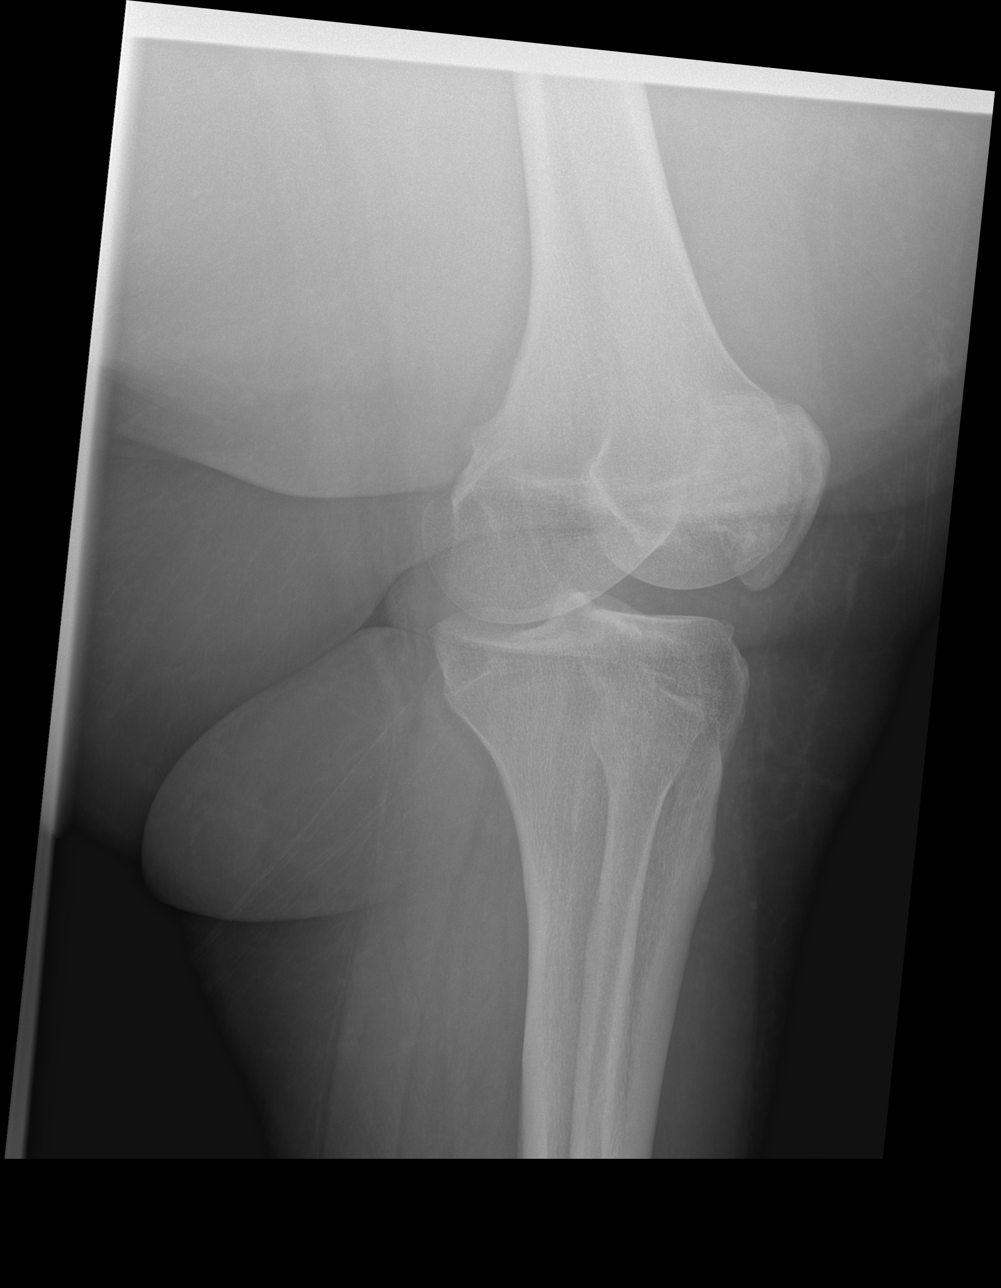

[t knee lat left]
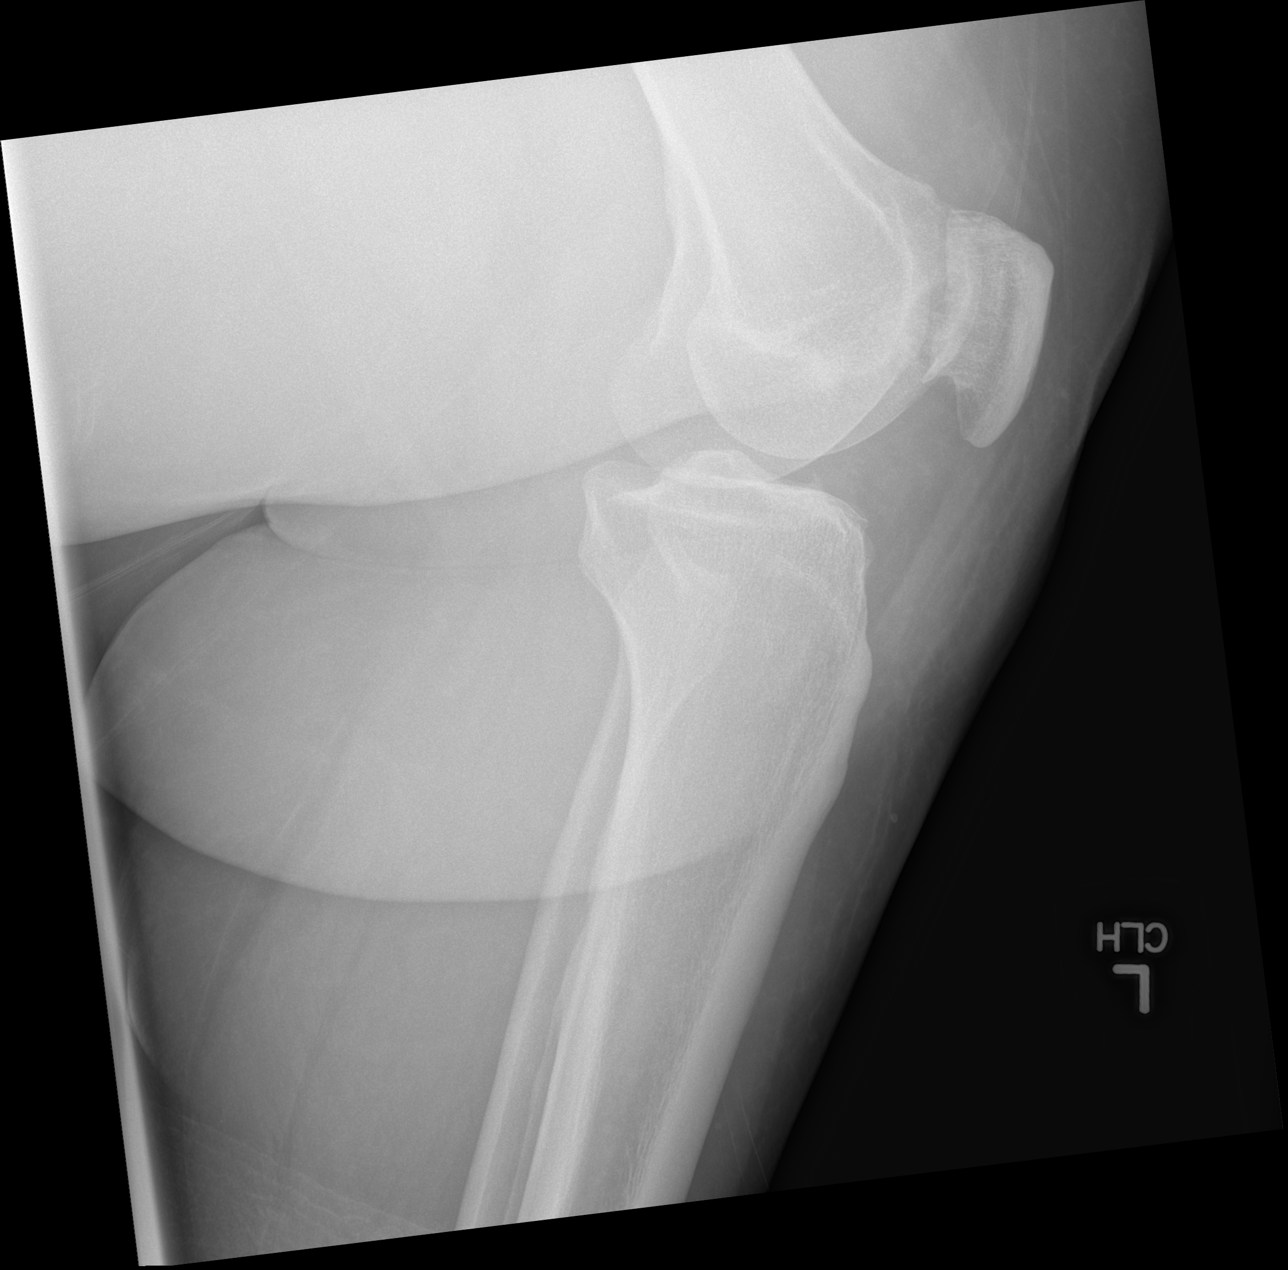

[4 of 4 positions shown; findings below may reference images not displayed]

FINDINGS: There is no evidence of fracture, dislocation, or joint effusion.
Mild degenerative spurring is seen involving the patella and lateral
compartment. No evidence of joint space narrowing. No other
significant bone abnormality identified. Soft tissues are
unremarkable.
IMPRESSION: No acute findings.  Early degenerative spurring.

## 2016-03-19 ENCOUNTER — Other Ambulatory Visit (INDEPENDENT_AMBULATORY_CARE_PROVIDER_SITE_OTHER): Payer: BC Managed Care – PPO

## 2016-03-19 DIAGNOSIS — Z0000 Encounter for general adult medical examination without abnormal findings: Secondary | ICD-10-CM

## 2016-03-19 DIAGNOSIS — Z0189 Encounter for other specified special examinations: Secondary | ICD-10-CM

## 2016-03-19 LAB — URINALYSIS, ROUTINE W REFLEX MICROSCOPIC
Bilirubin Urine: NEGATIVE
HGB URINE DIPSTICK: NEGATIVE
KETONES UR: 15 — AB
Leukocytes, UA: NEGATIVE
Nitrite: NEGATIVE
SPECIFIC GRAVITY, URINE: 1.025 (ref 1.000–1.030)
Total Protein, Urine: NEGATIVE
URINE GLUCOSE: NEGATIVE
UROBILINOGEN UA: 0.2 (ref 0.0–1.0)
pH: 5.5 (ref 5.0–8.0)

## 2016-03-19 LAB — HEPATIC FUNCTION PANEL
ALK PHOS: 59 U/L (ref 39–117)
ALT: 15 U/L (ref 0–53)
AST: 15 U/L (ref 0–37)
Albumin: 4.2 g/dL (ref 3.5–5.2)
BILIRUBIN DIRECT: 0.2 mg/dL (ref 0.0–0.3)
BILIRUBIN TOTAL: 0.7 mg/dL (ref 0.2–1.2)
TOTAL PROTEIN: 8.7 g/dL — AB (ref 6.0–8.3)

## 2016-03-19 LAB — CBC WITH DIFFERENTIAL/PLATELET
BASOS ABS: 0.1 10*3/uL (ref 0.0–0.1)
BASOS PCT: 0.6 % (ref 0.0–3.0)
Eosinophils Absolute: 0.1 10*3/uL (ref 0.0–0.7)
Eosinophils Relative: 0.9 % (ref 0.0–5.0)
HEMATOCRIT: 40.3 % (ref 39.0–52.0)
Hemoglobin: 13.5 g/dL (ref 13.0–17.0)
LYMPHS ABS: 2.9 10*3/uL (ref 0.7–4.0)
Lymphocytes Relative: 29 % (ref 12.0–46.0)
MCHC: 33.4 g/dL (ref 30.0–36.0)
MCV: 85.4 fl (ref 78.0–100.0)
MONOS PCT: 10.6 % (ref 3.0–12.0)
Monocytes Absolute: 1.1 10*3/uL — ABNORMAL HIGH (ref 0.1–1.0)
NEUTROS ABS: 5.9 10*3/uL (ref 1.4–7.7)
NEUTROS PCT: 58.9 % (ref 43.0–77.0)
PLATELETS: 306 10*3/uL (ref 150.0–400.0)
RBC: 4.72 Mil/uL (ref 4.22–5.81)
RDW: 14.6 % (ref 11.5–15.5)
WBC: 10 10*3/uL (ref 4.0–10.5)

## 2016-03-19 LAB — BASIC METABOLIC PANEL
BUN: 14 mg/dL (ref 6–23)
CALCIUM: 9.8 mg/dL (ref 8.4–10.5)
CHLORIDE: 102 meq/L (ref 96–112)
CO2: 26 meq/L (ref 19–32)
Creatinine, Ser: 0.58 mg/dL (ref 0.40–1.50)
GFR: 197.88 mL/min (ref >60.00)
Glucose, Bld: 94 mg/dL (ref 70–99)
Potassium: 4 mEq/L (ref 3.5–5.1)
SODIUM: 135 meq/L (ref 135–145)

## 2016-03-19 LAB — LIPID PANEL
CHOL/HDL RATIO: 4
Cholesterol: 168 mg/dL (ref 0–200)
HDL: 47.9 mg/dL (ref >39.00)
LDL Cholesterol: 109 mg/dL — ABNORMAL HIGH (ref 0–99)
NONHDL: 120.35
Triglycerides: 56 mg/dL (ref 0.0–149.0)
VLDL: 11.2 mg/dL (ref 0.0–40.0)

## 2016-03-19 LAB — PSA: PSA: 0.24 ng/mL (ref 0.10–4.00)

## 2016-03-19 LAB — TSH: TSH: 4.34 u[IU]/mL (ref 0.35–4.50)

## 2016-04-04 ENCOUNTER — Ambulatory Visit (INDEPENDENT_AMBULATORY_CARE_PROVIDER_SITE_OTHER): Payer: BC Managed Care – PPO | Admitting: Internal Medicine

## 2016-04-04 ENCOUNTER — Encounter: Payer: Self-pay | Admitting: Internal Medicine

## 2016-04-04 VITALS — BP 120/86 | HR 64 | Temp 98.0°F | Resp 20 | Wt >= 6400 oz

## 2016-04-04 DIAGNOSIS — Z0000 Encounter for general adult medical examination without abnormal findings: Secondary | ICD-10-CM | POA: Diagnosis not present

## 2016-04-04 NOTE — Patient Instructions (Signed)
Please continue all other medications as before, and refills have been done if requested.  Please have the pharmacy call with any other refills you may need.  Please continue your efforts at being more active, low cholesterol diet, and weight control.  You are otherwise up to date with prevention measures today.  Please keep your appointments with your specialists as you may have planned  Please return in 1 year for your yearly visit, or sooner if needed, with Lab testing done 3-5 days before  

## 2016-04-04 NOTE — Progress Notes (Signed)
Subjective:    Patient ID: Francisco Hoffman, male    DOB: 06/20/1976, 40 y.o.   MRN: [id number]  HPI  Here for wellness and f/u;  Overall doing ok;  Pt denies Chest pain, worsening SOB, DOE, wheezing, orthopnea, PND, worsening LE edema, palpitations, dizziness or syncope.  Pt denies neurological change such as new headache, facial or extremity weakness.  Pt denies polydipsia, polyuria, or low sugar symptoms. Pt states overall good compliance with treatment and medications, good tolerability, and has been trying to follow appropriate diet.  Pt denies worsening depressive symptoms, suicidal ideation or panic. No fever, night sweats, wt loss, loss of appetite, or other constitutional symptoms.  Pt states good ability with ADL's, has low fall risk, home safety reviewed and adequate, no other significant changes in hearing or vision, and only occasionally active with exercise.  Father died recently of some type of cancer.  By his home scales has lost 47 lbs intentionally. Past Medical History  Diagnosis Date  . Morbid obesity (HCC)   . ANXIETY 02/22/2011    Qualifier: Diagnosis of  By: Jonny RuizJohn MD, Len BlalockJames W   . ALLERGIC RHINITIS 04/26/2010    Qualifier: Diagnosis of  By: Jonny RuizJohn MD, Len BlalockJames W   . COMMON MIGRAINE 04/26/2010    Qualifier: Diagnosis of  By: Jonny RuizJohn MD, Len BlalockJames W   . MORBID OBESITY, HX OF 10/14/2007    Qualifier: Diagnosis of  By: Terrilee CroakKnight CMA, Darlene     No past surgical history on file.  reports that he has never smoked. He does not have any smokeless tobacco history on file. He reports that he does not drink alcohol or use illicit drugs. family history includes Heart disease in his father; Hypertension in his father; Lung cancer in his father. Allergies  Allergen Reactions  . Shrimp [Shellfish Allergy] Nausea Only   Current Outpatient Prescriptions on File Prior to Visit  Medication Sig Dispense Refill  . ibuprofen (ADVIL,MOTRIN) 200 MG tablet Take 200 mg by mouth every 6 (six) hours as needed  for mild pain.     No current facility-administered medications on file prior to visit.   Review of Systems Constitutional: Negative for increased diaphoresis, or other activity, appetite or siginficant weight change other than noted HENT: Negative for worsening hearing loss, ear pain, facial swelling, mouth sores and neck stiffness.   Eyes: Negative for other worsening pain, redness or visual disturbance.  Respiratory: Negative for choking or stridor Cardiovascular: Negative for other chest pain and palpitations.  Gastrointestinal: Negative for worsening diarrhea, blood in stool, or abdominal distention Genitourinary: Negative for hematuria, flank pain or change in urine volume.  Musculoskeletal: Negative for myalgias or other joint complaints.  Skin: Negative for other color change and wound or drainage.  Neurological: Negative for syncope and numbness. other than noted Hematological: Negative for adenopathy. or other swelling Psychiatric/Behavioral: Negative for hallucinations, SI, self-injury, decreased concentration or other worsening agitation.      Objective:   Physical Exam BP 120/86 mmHg  Pulse 64  Temp(Src) 98 F (36.7 C) (Oral)  Resp 20  Wt 456 lb (206.84 kg)  SpO2 98% VS noted,  Constitutional: Pt is oriented to person, place, and time. Appears well-developed and well-nourished, in no significant distress Head: Normocephalic and atraumatic  Eyes: Conjunctivae and EOM are normal. Pupils are equal, round, and reactive to light Right Ear: External ear normal.  Left Ear: External ear normal Nose: Nose normal.  Mouth/Throat: Oropharynx is clear and moist  Neck: Normal range of  motion. Neck supple. No JVD present. No tracheal deviation present or significant neck LA or mass Cardiovascular: Normal rate, regular rhythm, normal heart sounds and intact distal pulses.   Pulmonary/Chest: Effort normal and breath sounds without rales or wheezing  Abdominal: Soft. Bowel sounds  are normal. NT. No HSM  Musculoskeletal: Normal range of motion. Exhibits no edema Lymphadenopathy: Has no cervical adenopathy.  Neurological: Pt is alert and oriented to person, place, and time. Pt has normal reflexes. No cranial nerve deficit. Motor grossly intact Skin: Skin is warm and dry. No rash noted or new ulcers Psychiatric:  Has normal mood and affect. Behavior is normal.     Assessment & Plan:

## 2016-04-04 NOTE — Progress Notes (Signed)
Pre visit review using our clinic review tool, if applicable. No additional management support is needed unless otherwise documented below in the visit note. 

## 2016-04-06 NOTE — Assessment & Plan Note (Signed)

## 2017-05-01 ENCOUNTER — Other Ambulatory Visit (INDEPENDENT_AMBULATORY_CARE_PROVIDER_SITE_OTHER): Payer: BC Managed Care – PPO

## 2017-05-01 DIAGNOSIS — Z0000 Encounter for general adult medical examination without abnormal findings: Secondary | ICD-10-CM

## 2017-05-01 LAB — CBC WITH DIFFERENTIAL/PLATELET
BASOS PCT: 0.8 % (ref 0.0–3.0)
Basophils Absolute: 0.1 10*3/uL (ref 0.0–0.1)
EOS ABS: 0.1 10*3/uL (ref 0.0–0.7)
Eosinophils Relative: 1.2 % (ref 0.0–5.0)
HCT: 36.4 % — ABNORMAL LOW (ref 39.0–52.0)
HEMOGLOBIN: 12.1 g/dL — AB (ref 13.0–17.0)
LYMPHS ABS: 3.2 10*3/uL (ref 0.7–4.0)
Lymphocytes Relative: 34.2 % (ref 12.0–46.0)
MCHC: 33.2 g/dL (ref 30.0–36.0)
MCV: 86.4 fl (ref 78.0–100.0)
MONO ABS: 1 10*3/uL (ref 0.1–1.0)
MONOS PCT: 10.8 % (ref 3.0–12.0)
NEUTROS ABS: 5 10*3/uL (ref 1.4–7.7)
NEUTROS PCT: 53 % (ref 43.0–77.0)
Platelets: 201 10*3/uL (ref 150.0–400.0)
RBC: 4.21 Mil/uL — ABNORMAL LOW (ref 4.22–5.81)
RDW: 15.7 % — AB (ref 11.5–15.5)
WBC: 9.3 10*3/uL (ref 4.0–10.5)

## 2017-05-01 LAB — LIPID PANEL
CHOL/HDL RATIO: 4
Cholesterol: 182 mg/dL (ref 0–200)
HDL: 52 mg/dL (ref >39.00)
LDL Cholesterol: 123 mg/dL — ABNORMAL HIGH (ref 0–99)
NONHDL: 130.14
Triglycerides: 37 mg/dL (ref 0.0–149.0)
VLDL: 7.4 mg/dL (ref 0.0–40.0)

## 2017-05-01 LAB — URINALYSIS, ROUTINE W REFLEX MICROSCOPIC
BILIRUBIN URINE: NEGATIVE
HGB URINE DIPSTICK: NEGATIVE
KETONES UR: NEGATIVE
LEUKOCYTES UA: NEGATIVE
NITRITE: NEGATIVE
RBC / HPF: NONE SEEN (ref 0–?)
Specific Gravity, Urine: 1.025 (ref 1.000–1.030)
Total Protein, Urine: NEGATIVE
URINE GLUCOSE: NEGATIVE
UROBILINOGEN UA: 1 (ref 0.0–1.0)
pH: 6 (ref 5.0–8.0)

## 2017-05-01 LAB — BASIC METABOLIC PANEL
BUN: 16 mg/dL (ref 6–23)
CALCIUM: 9 mg/dL (ref 8.4–10.5)
CHLORIDE: 108 meq/L (ref 96–112)
CO2: 27 meq/L (ref 19–32)
Creatinine, Ser: 0.76 mg/dL (ref 0.40–1.50)
GFR: 144.08 mL/min (ref >60.00)
Glucose, Bld: 95 mg/dL (ref 70–99)
Potassium: 4 mEq/L (ref 3.5–5.1)
SODIUM: 140 meq/L (ref 135–145)

## 2017-05-01 LAB — HEPATIC FUNCTION PANEL
ALK PHOS: 60 U/L (ref 39–117)
ALT: 12 U/L (ref 0–53)
AST: 13 U/L (ref 0–37)
Albumin: 3.7 g/dL (ref 3.5–5.2)
BILIRUBIN DIRECT: 0.2 mg/dL (ref 0.0–0.3)
BILIRUBIN TOTAL: 0.7 mg/dL (ref 0.2–1.2)
TOTAL PROTEIN: 7 g/dL (ref 6.0–8.3)

## 2017-05-01 LAB — TSH: TSH: 2.29 u[IU]/mL (ref 0.35–4.50)

## 2017-05-01 LAB — PSA: PSA: 0.31 ng/mL (ref 0.10–4.00)

## 2017-05-16 ENCOUNTER — Encounter: Payer: Self-pay | Admitting: Internal Medicine

## 2017-05-16 ENCOUNTER — Ambulatory Visit (INDEPENDENT_AMBULATORY_CARE_PROVIDER_SITE_OTHER): Payer: BC Managed Care – PPO | Admitting: Internal Medicine

## 2017-05-16 ENCOUNTER — Other Ambulatory Visit (INDEPENDENT_AMBULATORY_CARE_PROVIDER_SITE_OTHER): Payer: BC Managed Care – PPO

## 2017-05-16 VITALS — BP 114/78 | HR 68 | Ht 70.0 in | Wt 384.0 lb

## 2017-05-16 DIAGNOSIS — D649 Anemia, unspecified: Secondary | ICD-10-CM | POA: Diagnosis not present

## 2017-05-16 DIAGNOSIS — Z114 Encounter for screening for human immunodeficiency virus [HIV]: Secondary | ICD-10-CM

## 2017-05-16 DIAGNOSIS — M79672 Pain in left foot: Secondary | ICD-10-CM | POA: Diagnosis not present

## 2017-05-16 DIAGNOSIS — Z0000 Encounter for general adult medical examination without abnormal findings: Secondary | ICD-10-CM | POA: Diagnosis not present

## 2017-05-16 DIAGNOSIS — E785 Hyperlipidemia, unspecified: Secondary | ICD-10-CM

## 2017-05-16 HISTORY — DX: Hyperlipidemia, unspecified: E78.5

## 2017-05-16 LAB — CBC WITH DIFFERENTIAL/PLATELET
BASOS PCT: 0.6 % (ref 0.0–3.0)
Basophils Absolute: 0.1 10*3/uL (ref 0.0–0.1)
EOS ABS: 0.1 10*3/uL (ref 0.0–0.7)
EOS PCT: 1.1 % (ref 0.0–5.0)
HCT: 39.9 % (ref 39.0–52.0)
HEMOGLOBIN: 13.1 g/dL (ref 13.0–17.0)
LYMPHS ABS: 2.8 10*3/uL (ref 0.7–4.0)
Lymphocytes Relative: 32.1 % (ref 12.0–46.0)
MCHC: 32.9 g/dL (ref 30.0–36.0)
MCV: 86.9 fl (ref 78.0–100.0)
MONO ABS: 0.8 10*3/uL (ref 0.1–1.0)
Monocytes Relative: 9.1 % (ref 3.0–12.0)
NEUTROS PCT: 57.1 % (ref 43.0–77.0)
Neutro Abs: 4.9 10*3/uL (ref 1.4–7.7)
Platelets: 212 10*3/uL (ref 150.0–400.0)
RBC: 4.59 Mil/uL (ref 4.22–5.81)
RDW: 15.1 % (ref 11.5–15.5)
WBC: 8.7 10*3/uL (ref 4.0–10.5)

## 2017-05-16 LAB — IBC PANEL
IRON: 48 ug/dL (ref 42–165)
Saturation Ratios: 16.6 % — ABNORMAL LOW (ref 20.0–50.0)
TRANSFERRIN: 206 mg/dL — AB (ref 212.0–360.0)

## 2017-05-16 LAB — FERRITIN: FERRITIN: 115.3 ng/mL (ref 22.0–322.0)

## 2017-05-16 NOTE — Progress Notes (Signed)
Subjective:    Patient ID: Francisco Hoffman, male    DOB: 02/11/1976, 41 y.o.   MRN: [id number]  HPI  Here for wellness and f/u;  Overall doing ok;  Pt denies Chest pain, worsening SOB, DOE, wheezing, orthopnea, PND, worsening LE edema, palpitations, dizziness or syncope.  Pt denies neurological change such as new headache, facial or extremity weakness.  Pt denies polydipsia, polyuria, or low sugar symptoms. Pt states overall good compliance with treatment and medications, good tolerability, and has been trying to follow appropriate diet.  Pt denies worsening depressive symptoms, suicidal ideation or panic. No fever, night sweats, wt loss, loss of appetite, or other constitutional symptoms.  Pt states good ability with ADL's, has low fall risk, home safety reviewed and adequate, no other significant changes in hearing or vision, and active with exercise with gym three times per wk.  Has been actively trying to lose wt.  Peak wt has been at 485.  Now down 100 lbs Wt Readings from Last 3 Encounters:  05/16/17 (!) 384 lb (174.2 kg)  04/04/16 (!) 456 lb (206.8 kg)  05/21/15 (!) 485 lb 6 oz (220.2 kg)  Denies worsening reflux, abd pain, dysphagia, n/v, bowel change or blood. No overt bleeding.  Does have some left heel pain moderate persistent for several wks, worse after gym work.  Past Medical History:  Diagnosis Date  . ALLERGIC RHINITIS 04/26/2010   Qualifier: Diagnosis of  By: Jonny Ruiz MD, Len Blalock   . ANXIETY 02/22/2011   Qualifier: Diagnosis of  By: Jonny Ruiz MD, Len Blalock   . COMMON MIGRAINE 04/26/2010   Qualifier: Diagnosis of  By: Jonny Ruiz MD, Len Blalock   . Hyperlipidemia 05/16/2017  . Morbid obesity (HCC)   . MORBID OBESITY, HX OF 10/14/2007   Qualifier: Diagnosis of  By: Terrilee Croak CMA, Darlene     No past surgical history on file.  reports that he has never smoked. He has never used smokeless tobacco. He reports that he does not drink alcohol or use drugs. family history includes Heart disease in his  father; Hypertension in his father; Lung cancer in his father. Allergies  Allergen Reactions  . Shrimp [Shellfish Allergy] Nausea Only   Current Outpatient Prescriptions on File Prior to Visit  Medication Sig Dispense Refill  . ibuprofen (ADVIL,MOTRIN) 200 MG tablet Take 200 mg by mouth every 6 (six) hours as needed for mild pain.     No current facility-administered medications on file prior to visit.    Review of Systems Constitutional: Negative for other unusual diaphoresis, sweats, appetite or weight changes HENT: Negative for other worsening hearing loss, ear pain, facial swelling, mouth sores or neck stiffness.   Eyes: Negative for other worsening pain, redness or other visual disturbance.  Respiratory: Negative for other stridor or swelling Cardiovascular: Negative for other palpitations or other chest pain  Gastrointestinal: Negative for worsening diarrhea or loose stools, blood in stool, distention or other pain Genitourinary: Negative for hematuria, flank pain or other change in urine volume.  Musculoskeletal: Negative for myalgias or other joint swelling.  Skin: Negative for other color change, or other wound or worsening drainage.  Neurological: Negative for other syncope or numbness. Hematological: Negative for other adenopathy or swelling Psychiatric/Behavioral: Negative for hallucinations, other worsening agitation, SI, self-injury, or new decreased concentration All other system neg per pt    Objective:   Physical Exam BP 114/78   Pulse 68   Ht 5\' 10"  (1.778 m)   Wt (!) 434 lb (  196.9 kg)   SpO2 98%   BMI 62.27 kg/m  VS noted,  Constitutional: Pt is oriented to person, place, and time. Appears well-developed and well-nourished, in no significant distress and comfortable Head: Normocephalic and atraumatic  Eyes: Conjunctivae and EOM are normal. Pupils are equal, round, and reactive to light Right Ear: External ear normal without discharge Left Ear: External ear  normal without discharge Nose: Nose without discharge or deformity Mouth/Throat: Oropharynx is without other ulcerations and moist  Neck: Normal range of motion. Neck supple. No JVD present. No tracheal deviation present or significant neck LA or mass Cardiovascular: Normal rate, regular rhythm, normal heart sounds and intact distal pulses.   Pulmonary/Chest: WOB normal and breath sounds without rales or wheezing  Abdominal: Soft. Bowel sounds are normal. NT. No HSM  Musculoskeletal: Normal range of motion. Exhibits no edema Lymphadenopathy: Has no other cervical adenopathy.  Neurological: Pt is alert and oriented to person, place, and time. Pt has normal reflexes. No cranial nerve deficit. Motor grossly intact, Gait intact Skin: Skin is warm and dry. No rash noted or new ulcerations Psychiatric:  Has normal mood and affect. Behavior is normal without agitation' No other exam findings Lab Results  Component Value Date   WBC 9.3 05/01/2017   HGB 12.1 (L) 05/01/2017   HCT 36.4 (L) 05/01/2017   PLT 201.0 05/01/2017   GLUCOSE 95 05/01/2017   CHOL 182 05/01/2017   TRIG 37.0 05/01/2017   HDL 52.00 05/01/2017   LDLCALC 123 (H) 05/01/2017   ALT 12 05/01/2017   AST 13 05/01/2017   NA 140 05/01/2017   K 4.0 05/01/2017   CL 108 05/01/2017   CREATININE 0.76 05/01/2017   BUN 16 05/01/2017   CO2 27 05/01/2017   TSH 2.29 05/01/2017   PSA 0.31 05/01/2017   HGBA1C 5.9 10/09/2007       Assessment & Plan:

## 2017-05-16 NOTE — Assessment & Plan Note (Signed)
?   Heel spur., for sport med referral per pt request

## 2017-05-16 NOTE — Assessment & Plan Note (Signed)
Mild for lower chol diet, declines statin 

## 2017-05-16 NOTE — Assessment & Plan Note (Signed)
Mild, for f/u labs today

## 2017-05-16 NOTE — Patient Instructions (Signed)
Please continue all other medications as before, and refills have been done if requested.  Please have the pharmacy call with any other refills you may need.  Please continue your efforts at being more active, low cholesterol diet, and weight control.  You are otherwise up to date with prevention measures today.  Please keep your appointments with your specialists as you may have planned  You will be contacted regarding the referral for: Dr Katrinka BlazingSmith for the left heel (or you can make appt as you leave today)  Please go to the LAB in the Basement (turn left off the elevator) for the tests to be done today  You will be contacted by phone if any changes need to be made immediately.  Otherwise, you will receive a letter about your results with an explanation, but please check with MyChart first.  Please remember to sign up for MyChart if you have not done so, as this will be important to you in the future with finding out test results, communicating by private email, and scheduling acute appointments online when needed.  Please return in 1 year for your yearly visit, or sooner if needed, with Lab testing done 3-5 days before

## 2017-05-16 NOTE — Assessment & Plan Note (Signed)

## 2017-05-17 ENCOUNTER — Encounter: Payer: Self-pay | Admitting: Internal Medicine

## 2017-05-17 LAB — HIV ANTIBODY (ROUTINE TESTING W REFLEX): HIV 1&2 Ab, 4th Generation: NONREACTIVE

## 2017-05-23 ENCOUNTER — Encounter: Payer: Self-pay | Admitting: Internal Medicine

## 2017-06-29 NOTE — Progress Notes (Signed)
[facility] [address] [place], [place] [address] Phone: [phone] Subjective:    I'm seeing this patient by the request  of:  Corwin Levins, MD   CC: Left heel pain  [id number]  Francisco Hoffman is a 41 y.o. male coming in with complaint of left heel pain. Patient rates that this is been going on for quite some time. Was having worsening pain when he was having a different job. Asian then started to be more centered. States that the pain in the heel went away. Now working out more in the pain is coming back. Seems to be worse after activity. Patient states that unfortunately can be severe enough that makes him limp. Patient states usually when he starts increasing activity again it seems to get better. Denies any numbness or tingling. Denies any swelling. Does not remember any specific improvement with over-the-counter medications. Has tried over-the-counter orthotics which does not seem to make a difference. Wearing shoes though does seem to be helpful.     Past Medical History:  Diagnosis Date  . ALLERGIC RHINITIS 04/26/2010   Qualifier: Diagnosis of  By: Jonny Ruiz MD, Len Blalock   . ANXIETY 02/22/2011   Qualifier: Diagnosis of  By: Jonny Ruiz MD, Len Blalock   . COMMON MIGRAINE 04/26/2010   Qualifier: Diagnosis of  By: Jonny Ruiz MD, Len Blalock   . Hyperlipidemia 05/16/2017  . Morbid obesity (HCC)   . MORBID OBESITY, HX OF 10/14/2007   Qualifier: Diagnosis of  By: Terrilee Croak CMA, Darlene     No past surgical history on file. Social History   Social History  . Marital status: Single    Spouse name: N/A  . Number of children: N/A  . Years of education: N/A   Social History Main Topics  . Smoking status: Never Smoker  . Smokeless tobacco: Never Used  . Alcohol use No  . Drug use: No  . Sexual activity: Not Asked   Other Topics Concern  . None   Social History Narrative  . None   Allergies  Allergen Reactions  . Shrimp [Shellfish Allergy] Nausea Only    Family History  Problem Relation Age of Onset  . Lung cancer Father   . Heart disease Father   . Hypertension Father     Past medical history, social, surgical and family history all reviewed in electronic medical record.  No pertanent information unless stated regarding to the chief complaint.   Review of Systems:Review of systems updated and as accurate as of 07/01/17  No headache, visual changes, nausea, vomiting, diarrhea, constipation, dizziness, abdominal pain, skin rash, fevers, chills, night sweats, weight loss, swollen lymph nodes, body aches, joint swelling,chest pain, shortness of breath, mood changes. Positive muscle aches  Objective  Blood pressure 118/82, pulse 62, height 5\' 11"  (1.803 m), weight (!) 388 lb (176 kg). Systems examined below as of 07/01/17   General: No apparent distress alert and oriented x3 mood and affect normal, dressed appropriately. Morbidly obese  HEENT: Pupils equal, extraocular movements intact  Respiratory: Patient's speak in full sentences and does not appear short of breath  Cardiovascular: No lower extremity edema, non tender, no erythema  Skin: Warm dry intact with no signs of infection or rash on extremities or on axial skeleton.  Abdomen: Soft nontender  Neuro: Cranial nerves II through XII are intact, neurovascularly intact in all extremities with 2+ DTRs and 2+ pulses.  Lymph: No lymphadenopathy of posterior or anterior cervical chain or axillae  bilaterally.  Gaitmild antalgic gait  MSK:  Non tender with full range of motion and good stability and symmetric strength and tone of shoulders, elbows, wrist, hip, knee and ankles bilaterally.  Foot exam showsSevere pes planus bilaterally left greater then right. Over pronation of the hindfoot. Pain over the medial calcaneal area on the left foot. Mild decrease in range of motion in all planes of the ankles bilaterally  MSK US performed of: Left ankle  This study was ordered, performed, and  interpreted by Terrilee FilesZach Amiya Escamilla D.O.  Foot/Ankle:   Limited ultrasound of the foot shows the patient does have severe thickening of 1.46 cm of the plantar fascia on the left side. No true cortical defect noted. Mild soft tissue swelling noted.  IMPRESSION: Plantar fasciitis with mild heel contusion  Procedure note 97110; 15 minutes spent for Therapeutic exercises as stated in above notes.  This included exercises focusing on stretching, strengthening, with significant focus on eccentric aspects.  Exercises for the foot include:  Stretches to help lengthen the lower leg and plantar fascia areas Theraband exercises for the lower leg and ankle to help strengthen the surrounding area- dorsiflexion, plantarflexion, inversion, eversion Massage rolling on the plantar surface of the foot with a frozen bottle, tennis ball or golf ball Towel or marble pick-ups to strengthen the plantar surface of the foot Weight bearing exercises to increase balance and overall stability   Proper technique shown and discussed handout in great detail with ATC.  All questions were discussed and answered.      Impression and Recommendations:     This case required medical decision making of moderate complexity.      Note: This dictation was prepared with Dragon dictation along with smaller phrase technology. Any transcriptional errors that result from this process are unintentional.

## 2017-07-01 ENCOUNTER — Ambulatory Visit (INDEPENDENT_AMBULATORY_CARE_PROVIDER_SITE_OTHER): Payer: BC Managed Care – PPO | Admitting: Family Medicine

## 2017-07-01 ENCOUNTER — Ambulatory Visit: Payer: Self-pay

## 2017-07-01 ENCOUNTER — Encounter: Payer: Self-pay | Admitting: Family Medicine

## 2017-07-01 VITALS — BP 118/82 | HR 62 | Ht 71.0 in | Wt 388.0 lb

## 2017-07-01 DIAGNOSIS — M722 Plantar fascial fibromatosis: Secondary | ICD-10-CM | POA: Diagnosis not present

## 2017-07-01 DIAGNOSIS — M79672 Pain in left foot: Principal | ICD-10-CM

## 2017-07-01 DIAGNOSIS — G8929 Other chronic pain: Secondary | ICD-10-CM | POA: Diagnosis not present

## 2017-07-01 NOTE — Patient Instructions (Signed)
Good to see you.  Ice bath 10 minutes at night Exercises 3 times a week.  Good shoes with rigid bottom.  Dierdre HarnessKeen, Dansko, Merrell or New balance greater then 700 Spenco orthotics "total support" online would be great  Avoid being barefoot Vitamin D 2000 IU daily  Try elliptical or walking in the pool for now.  See me again in 4-6 weeks.

## 2017-07-01 NOTE — Assessment & Plan Note (Signed)
Patient is important plantar fasciitis. Discussed with patient at great length. Discussed icing regimen and home exercises. Discussed Hopper shoes, over-the-counter orthotics, and the importance of weight loss, non-impact exercises. Patient will try these different changes and come back and see me again in 3-4 weeks. Worsening symptoms we'll consider formal physical therapy, nitroglycerin patches, and potential injections.

## 2017-08-01 ENCOUNTER — Telehealth: Payer: Self-pay | Admitting: Internal Medicine

## 2017-08-01 NOTE — Telephone Encounter (Signed)
Patient Name: Francisco FallingERRY Sforza DOB: 1976-09-11 Initial Comment Caller states urinating and wiped blood off the tip of his penis, only happened one time Nurse Assessment Nurse: Charna Elizabethrumbull, RN, Lynden Angathy Date/Time (Eastern Time): 08/01/2017 8:54:13 AM Confirm and document reason for call. If symptomatic, describe symptoms. ---Caller states that he noticed blood in his urine one time about 4 days ago. No injury in the past week. No flank pain. No fever. Alert and responsive. Does the patient have any new or worsening symptoms? ---Yes Will a triage be completed? ---Yes Related visit to physician within the last 2 weeks? ---No Does the PT have any chronic conditions? (i.e. diabetes, asthma, etc.) ---No Is this a behavioral health or substance abuse call? ---No Guidelines Guideline Title Affirmed Question Affirmed Notes Urine - Blood In Blood in urine (Exception: could be normal menstrual bleeding) Final Disposition User See Physician within 24 Hours Trumbull, RN, Lynden AngCathy Comments Francisco Hoffman is out of town and will go to an urgent care facility. Encouraged to call back as needed.  Referrals GO TO FACILITY OTHER - SPECIFY Disagree/Comply: Comply

## 2017-08-05 ENCOUNTER — Ambulatory Visit: Payer: BC Managed Care – PPO | Admitting: Family Medicine

## 2017-08-08 ENCOUNTER — Ambulatory Visit: Payer: BC Managed Care – PPO | Admitting: Family Medicine

## 2017-08-13 ENCOUNTER — Ambulatory Visit (INDEPENDENT_AMBULATORY_CARE_PROVIDER_SITE_OTHER): Payer: BC Managed Care – PPO | Admitting: Family Medicine

## 2017-08-13 ENCOUNTER — Encounter: Payer: Self-pay | Admitting: Family Medicine

## 2017-08-13 VITALS — BP 116/84 | HR 67 | Ht 71.0 in | Wt >= 6400 oz

## 2017-08-13 DIAGNOSIS — M722 Plantar fascial fibromatosis: Secondary | ICD-10-CM | POA: Diagnosis not present

## 2017-08-13 MED ORDER — NITROGLYCERIN 0.2 MG/HR TD PT24: MEDICATED_PATCH | TRANSDERMAL | 1 refills | Status: DC

## 2017-08-13 NOTE — Patient Instructions (Signed)
God to see you  I am glad a little improvement Get back into your routine.  The gym is great  Avoid being barefoot PT will be calling you  Nitroglycerin Protocol   Apply 1/4 nitroglycerin patch to affected area daily.  Change position of patch within the affected area every 24 hours.  You may experience a headache during the first 1-2 weeks of using the patch, these should subside.  If you experience headaches after beginning nitroglycerin patch treatment, you may take your preferred over the counter pain reliever.  Another side effect of the nitroglycerin patch is skin irritation or rash related to patch adhesive.  Please notify our office if you develop more severe headaches or rash, and stop the patch.  Tendon healing with nitroglycerin patch may require 12 to 24 weeks depending on the extent of injury.  Men should not use if taking Viagra, Cialis, or Levitra.   Do not use if you have migraines or rosacea.   See me again in 4-6 weeks.

## 2017-08-13 NOTE — Assessment & Plan Note (Signed)
No improvement from previous exam. Discussed with patient about over-the-counter orthotics, we'll start formal physical therapy. Nitroglycerin patches given. We discussed icing regimen which patient will do a more regular basis. Patient also given the option for an injection which patient declined. Encourage weight loss. Follow-up again in 4 weeks

## 2017-08-13 NOTE — Progress Notes (Signed)
[facility] [address] [place], [place] [address] Phone: [phone] Subjective:    I'm seeing this patient by the request  of:  Corwin Levins, MD   CC: Left heel pain f/u  MCN:[id number]  Francisco Hoffman is a 41 y.o. male coming in with complaint of left heel pain.Patient sent have significant pes planus as well as patient having a heel contusion. Patient had signs and symptoms consistent with more of the plantar fasciitis. Patient has been traveling a lot and doing a lot of driving. Not doing the exercises regularly which is also causing him to gain weight. Having some mild increase in worsening pain. Denies any new injury. Very minimal benefit if anything. Does notice that if he does do the stretches it does feel better though.    Past Medical History:  Diagnosis Date  . ALLERGIC RHINITIS 04/26/2010   Qualifier: Diagnosis of  By: Jonny Ruiz MD, Len Blalock   . ANXIETY 02/22/2011   Qualifier: Diagnosis of  By: Jonny Ruiz MD, Len Blalock   . COMMON MIGRAINE 04/26/2010   Qualifier: Diagnosis of  By: Jonny Ruiz MD, Len Blalock   . Hyperlipidemia 05/16/2017  . Morbid obesity (HCC)   . MORBID OBESITY, HX OF 10/14/2007   Qualifier: Diagnosis of  By: Terrilee Croak CMA, Darlene     No past surgical history on file. Social History   Social History  . Marital status: Single    Spouse name: N/A  . Number of children: N/A  . Years of education: N/A   Social History Main Topics  . Smoking status: Never Smoker  . Smokeless tobacco: Never Used  . Alcohol use No  . Drug use: No  . Sexual activity: Not Asked   Other Topics Concern  . None   Social History Narrative  . None   Allergies  Allergen Reactions  . Shrimp [Shellfish Allergy] Nausea Only   Family History  Problem Relation Age of Onset  . Lung cancer Father   . Heart disease Father   . Hypertension Father     Past medical history, social, surgical and family history all reviewed in electronic medical record.  No pertanent  information unless stated regarding to the chief complaint.   Review of Systems: No headache, visual changes, nausea, vomiting, diarrhea, constipation, dizziness, abdominal pain, skin rash, fevers, chills, night sweats, weight loss, swollen lymph nodes, body aches, joint swelling, muscle aches, chest pain, shortness of breath, mood changes.   Objective  Blood pressure 116/84, pulse 67, height 5\' 11"  (1.803 m), weight (!) 401 lb (181.9 kg), SpO2 97 %.   Systems examined below as of 08/13/17 General: NAD A&O x3 mood, affect normal Morbidly obese HEENT: Pupils equal, extraocular movements intact no nystagmus Respiratory: not short of breath at rest or with speaking Cardiovascular: No lower extremity edema, non tender Skin: Warm dry intact with no signs of infection or rash on extremities or on axial skeleton. Abdomen: Soft nontender, no masses Neuro: Cranial nerves  intact, neurovascularly intact in all extremities with 2+ DTRs and 2+ pulses. Lymph: No lymphadenopathy appreciated today  Gait normal with good balance and coordination.  MSK: Non tender with full range of motion and good stability and symmetric strength and tone of shoulders, elbows, wrist,  knee hips and ankles bilaterally.   Foot exam showsSevere pes planus bilaterally left greater then right. Over pronation of the hindfoot. Pain over the medial calcaneal area on the left foot. Mild decrease in range of motion  in all planes of the ankles bilaterally no change from previous exam       Impression and Recommendations:     This case required medical decision making of moderate complexity.      Note: This dictation was prepared with Dragon dictation along with smaller phrase technology. Any transcriptional errors that result from this process are unintentional.

## 2017-08-14 ENCOUNTER — Other Ambulatory Visit (INDEPENDENT_AMBULATORY_CARE_PROVIDER_SITE_OTHER): Payer: BC Managed Care – PPO

## 2017-08-14 ENCOUNTER — Ambulatory Visit (INDEPENDENT_AMBULATORY_CARE_PROVIDER_SITE_OTHER): Payer: BC Managed Care – PPO | Admitting: Internal Medicine

## 2017-08-14 ENCOUNTER — Encounter: Payer: Self-pay | Admitting: Internal Medicine

## 2017-08-14 VITALS — BP 116/80 | HR 77 | Temp 98.0°F | Ht 71.0 in | Wt 399.0 lb

## 2017-08-14 DIAGNOSIS — R319 Hematuria, unspecified: Secondary | ICD-10-CM

## 2017-08-14 DIAGNOSIS — Z0001 Encounter for general adult medical examination with abnormal findings: Secondary | ICD-10-CM | POA: Diagnosis not present

## 2017-08-14 DIAGNOSIS — Z0000 Encounter for general adult medical examination without abnormal findings: Secondary | ICD-10-CM

## 2017-08-14 LAB — URINALYSIS, ROUTINE W REFLEX MICROSCOPIC
BILIRUBIN URINE: NEGATIVE
HGB URINE DIPSTICK: NEGATIVE
KETONES UR: 15 — AB
Leukocytes, UA: NEGATIVE
Nitrite: NEGATIVE
PH: 6 (ref 5.0–8.0)
RBC / HPF: NONE SEEN (ref 0–?)
SPECIFIC GRAVITY, URINE: 1.025 (ref 1.000–1.030)
Total Protein, Urine: NEGATIVE
URINE GLUCOSE: NEGATIVE
UROBILINOGEN UA: 0.2 (ref 0.0–1.0)
WBC UA: NONE SEEN (ref 0–?)

## 2017-08-14 LAB — BASIC METABOLIC PANEL
BUN: 13 mg/dL (ref 6–23)
CHLORIDE: 101 meq/L (ref 96–112)
CO2: 27 meq/L (ref 19–32)
CREATININE: 0.59 mg/dL (ref 0.40–1.50)
Calcium: 9.5 mg/dL (ref 8.4–10.5)
GFR: 192.72 mL/min (ref >60.00)
Glucose, Bld: 89 mg/dL (ref 70–99)
POTASSIUM: 4 meq/L (ref 3.5–5.1)
Sodium: 135 mEq/L (ref 135–145)

## 2017-08-14 LAB — CBC WITH DIFFERENTIAL/PLATELET
BASOS ABS: 0.1 10*3/uL (ref 0.0–0.1)
BASOS PCT: 0.8 % (ref 0.0–3.0)
EOS ABS: 0.2 10*3/uL (ref 0.0–0.7)
Eosinophils Relative: 2.9 % (ref 0.0–5.0)
HEMATOCRIT: 38.3 % — AB (ref 39.0–52.0)
Hemoglobin: 12.6 g/dL — ABNORMAL LOW (ref 13.0–17.0)
LYMPHS PCT: 26.6 % (ref 12.0–46.0)
Lymphs Abs: 1.8 10*3/uL (ref 0.7–4.0)
MCHC: 32.9 g/dL (ref 30.0–36.0)
MCV: 88.5 fl (ref 78.0–100.0)
MONO ABS: 0.7 10*3/uL (ref 0.1–1.0)
Monocytes Relative: 9.6 % (ref 3.0–12.0)
NEUTROS ABS: 4.2 10*3/uL (ref 1.4–7.7)
Neutrophils Relative %: 60.1 % (ref 43.0–77.0)
PLATELETS: 220 10*3/uL (ref 150.0–400.0)
RBC: 4.33 Mil/uL (ref 4.22–5.81)
RDW: 14.4 % (ref 11.5–15.5)
WBC: 6.9 10*3/uL (ref 4.0–10.5)

## 2017-08-14 LAB — HEPATIC FUNCTION PANEL
ALT: 19 U/L (ref 0–53)
AST: 20 U/L (ref 0–37)
Albumin: 4.2 g/dL (ref 3.5–5.2)
Alkaline Phosphatase: 59 U/L (ref 39–117)
BILIRUBIN DIRECT: 0.1 mg/dL (ref 0.0–0.3)
TOTAL PROTEIN: 7.7 g/dL (ref 6.0–8.3)
Total Bilirubin: 0.6 mg/dL (ref 0.2–1.2)

## 2017-08-14 LAB — PSA: PSA: 0.26 ng/mL (ref 0.10–4.00)

## 2017-08-14 LAB — TSH: TSH: 2.45 u[IU]/mL (ref 0.35–4.50)

## 2017-08-14 LAB — LIPID PANEL
Cholesterol: 202 mg/dL — ABNORMAL HIGH (ref 0–200)
HDL: 58.2 mg/dL (ref >39.00)
LDL Cholesterol: 134 mg/dL — ABNORMAL HIGH (ref 0–99)
NonHDL: 143.46
TRIGLYCERIDES: 48 mg/dL (ref 0.0–149.0)
Total CHOL/HDL Ratio: 3
VLDL: 9.6 mg/dL (ref 0.0–40.0)

## 2017-08-14 NOTE — Patient Instructions (Signed)
Please continue all other medications as before, and refills have been done if requested.  Please have the pharmacy call with any other refills you may need.  Please continue your efforts at being more active, low cholesterol diet, and weight control.  You are otherwise up to date with prevention measures today.  Please keep your appointments with your specialists as you may have planned  You will be contacted regarding the referral for: Urology  Please go to the LAB in the Basement (turn left off the elevator) for the tests to be done today  You will be contacted by phone if any changes need to be made immediately.  Otherwise, you will receive a letter about your results with an explanation, but please check with MyChart first.  Please remember to sign up for MyChart if you have not done so, as this will be important to you in the future with finding out test results, communicating by private email, and scheduling acute appointments online when needed.  Please return in 1 year for your yearly visit, or sooner if needed, with Lab testing done 3-5 days before  

## 2017-08-14 NOTE — Assessment & Plan Note (Addendum)
One episode small volume and noticed on tissue paper as well to penis; not GI related, no hx of stones or other bladder dz, for urology referral as cannot r/o malignancy,  to f/u any worsening symptoms or concerns

## 2017-08-14 NOTE — Progress Notes (Signed)
Subjective:    Patient ID: Francisco Hoffman, male    DOB: August 16, 1976, 41 y.o.   MRN: [id number]  HPI  Here for wellness and f/u;  Overall doing ok;  Pt denies Chest pain, worsening SOB, DOE, wheezing, orthopnea, PND, worsening LE edema, palpitations, dizziness or syncope.  Pt denies neurological change such as new headache, facial or extremity weakness.  Pt denies polydipsia, polyuria, or low sugar symptoms. Pt states overall good compliance with treatment and medications, good tolerability, and has been trying to follow appropriate diet.  Pt denies worsening depressive symptoms, suicidal ideation or panic. No fever, night sweats, wt loss, loss of appetite, or other constitutional symptoms.  Pt states good ability with ADL's, has low fall risk, home safety reviewed and adequate, no other significant changes in hearing or vision, and only occasionally active with exercise.  Sleepy today which is unusual for him, started yesterday after partial nitropatch placement per Dr Katrinka Blazing, happening again this am after patch this am as well.   BP Readings from Last 3 Encounters:  08/14/17 116/80  08/13/17 116/84  07/01/17 118/82   Wt Readings from Last 3 Encounters:  08/14/17 (!) 399 lb (181 kg)  08/13/17 (!) 401 lb (181.9 kg)  07/01/17 (!) 388 lb (176 kg)  Declines flu shot. 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  Also mentions has one episode BRB with urination 1 time last wk, but Denies urinary symptoms such as dysuria, frequency, urgency, flank pain, or n/v, fever, chills.  No prior hx. Has not seen urology in past, no hx of stones Past Medical History:  Diagnosis Date  . ALLERGIC RHINITIS 04/26/2010   Qualifier: Diagnosis of  By: Jonny Ruiz MD, Len Blalock   . ANXIETY 02/22/2011   Qualifier: Diagnosis of  By: Jonny Ruiz MD, Len Blalock   . COMMON MIGRAINE 04/26/2010   Qualifier: Diagnosis of  By: Jonny Ruiz MD, Len Blalock   . Hyperlipidemia 05/16/2017  . Morbid obesity (HCC)   . MORBID OBESITY, HX OF 10/14/2007   Qualifier: Diagnosis of  By: Terrilee Croak CMA, Darlene     No past surgical history on file.  reports that he has never smoked. He has never used smokeless tobacco. He reports that he does not drink alcohol or use drugs. family history includes Heart disease in his father; Hypertension in his father; Lung cancer in his father. Allergies  Allergen Reactions  . Shrimp [Shellfish Allergy] Nausea Only   Current Outpatient Prescriptions on File Prior to Visit  Medication Sig Dispense Refill  . ibuprofen (ADVIL,MOTRIN) 200 MG tablet Take 200 mg by mouth every 6 (six) hours as needed for mild pain.    . nitroGLYCERIN (NITRODUR - DOSED IN MG/24 HR) 0.2 mg/hr patch 1/4 patch daily 30 patch 1   No current facility-administered medications on file prior to visit.    Review of Systems Constitutional: Negative for other unusual diaphoresis, sweats, appetite or weight changes HENT: Negative for other worsening hearing loss, ear pain, facial swelling, mouth sores or neck stiffness.   Eyes: Negative for other worsening pain, redness or other visual disturbance.  Respiratory: Negative for other stridor or swelling Cardiovascular: Negative for other palpitations or other chest pain  Gastrointestinal: Negative for worsening diarrhea or loose stools, blood in stool, distention or other pain Genitourinary: Negative for hematuria, flank pain or other change in urine volume.  Musculoskeletal: Negative for myalgias or other joint swelling.  Skin: Negative for other color change, or other wound or worsening drainage.  Neurological: Negative for other syncope or numbness. Hematological: Negative for other adenopathy or swelling Psychiatric/Behavioral: Negative for hallucinations, other worsening agitation, SI, self-injury, or new decreased concentration All other system neg per pt    Objective:   Physical Exam BP 116/80   Pulse 77   Temp 98 F (36.7 C) (Oral)   Ht 5\' 11"  (1.803 m)   Wt (!) 399 lb (181 kg)    SpO2 100%   BMI 55.65 kg/m  VS noted, supermorbid obese Constitutional: Pt is oriented to person, place, and time. Appears well-developed and well-nourished, in no significant distress and comfortable Head: Normocephalic and atraumatic  Eyes: Conjunctivae and EOM are normal. Pupils are equal, round, and reactive to light Right Ear: External ear normal without discharge Left Ear: External ear normal without discharge Nose: Nose without discharge or deformity Mouth/Throat: Oropharynx is without other ulcerations and moist  Neck: Normal range of motion. Neck supple. No JVD present. No tracheal deviation present or significant neck LA or mass Cardiovascular: Normal rate, regular rhythm, normal heart sounds and intact distal pulses.   Pulmonary/Chest: WOB normal and breath sounds without rales or wheezing  Abdominal: Soft. Bowel sounds are normal. NT. No HSM  Musculoskeletal: Normal range of motion. Exhibits no edema Lymphadenopathy: Has no other cervical adenopathy.  Neurological: Pt is alert and oriented to person, place, and time. Pt has normal reflexes. No cranial nerve deficit. Motor grossly intact, Gait intact Skin: Skin is warm and dry. No rash noted or new ulcerations Psychiatric:  Has normal mood and affect. Behavior is normal without agitation No other exam findings  Transthoracic Echocardiogram 05-Jul-1976 SUMMARY - Overall left ventricular systolic function was normal. There were    no left ventricular regional wall motion abnormalities. - Not well seen. Prominant non-coronary cusp.Cannot totallyh R/O    bicuspid valve but no AS or AR IMPRESSIONS - There was no echocardiographic evidence for a cardiac source of    embolism.    Assessment & Plan:

## 2017-08-14 NOTE — Assessment & Plan Note (Signed)

## 2017-08-15 ENCOUNTER — Encounter: Payer: Self-pay | Admitting: Internal Medicine

## 2017-09-12 ENCOUNTER — Encounter: Payer: Self-pay | Admitting: Internal Medicine

## 2017-09-27 ENCOUNTER — Encounter: Payer: Self-pay | Admitting: Family Medicine

## 2017-10-17 ENCOUNTER — Emergency Department (HOSPITAL_COMMUNITY)
Admission: EM | Admit: 2017-10-17 | Discharge: 2017-10-17 | Discharge status: Against Medical Advice | Payer: BC Managed Care – PPO

## 2017-10-17 NOTE — ED Notes (Signed)
Pt sts he dosent want to wait, he is leaving

## 2018-08-15 ENCOUNTER — Encounter: Payer: BC Managed Care – PPO | Admitting: Internal Medicine

## 2018-09-02 ENCOUNTER — Encounter: Payer: Self-pay | Admitting: Internal Medicine

## 2018-09-02 ENCOUNTER — Ambulatory Visit: Payer: BC Managed Care – PPO | Admitting: Internal Medicine

## 2018-09-02 VITALS — BP 136/88 | HR 75 | Temp 98.3°F | Ht 70.0 in | Wt >= 6400 oz

## 2018-09-02 DIAGNOSIS — F411 Generalized anxiety disorder: Secondary | ICD-10-CM

## 2018-09-02 DIAGNOSIS — J309 Allergic rhinitis, unspecified: Secondary | ICD-10-CM | POA: Diagnosis not present

## 2018-09-02 DIAGNOSIS — R05 Cough: Secondary | ICD-10-CM | POA: Diagnosis not present

## 2018-09-02 DIAGNOSIS — R059 Cough, unspecified: Secondary | ICD-10-CM

## 2018-09-02 MED ORDER — HYDROCODONE-HOMATROPINE 5-1.5 MG/5ML PO SYRP: 5.0000 mL | ORAL_SOLUTION | Freq: Four times a day (QID) | ORAL | 0 refills | Status: DC | PRN

## 2018-09-02 MED ORDER — HYDROCODONE-HOMATROPINE 5-1.5 MG/5ML PO SYRP: 5.0000 mL | ORAL_SOLUTION | Freq: Four times a day (QID) | ORAL | 0 refills | Status: AC | PRN

## 2018-09-02 MED ORDER — AZITHROMYCIN 250 MG PO TABS: ORAL_TABLET | ORAL | 1 refills | Status: DC

## 2018-09-02 NOTE — Assessment & Plan Note (Signed)
Mild to mod, c/w bronchitis vs pna, for antibx course, cough medicine prn use,  to f/u any worsening symptoms or concerns

## 2018-09-02 NOTE — Patient Instructions (Signed)
Please take all new medication as prescribed - the antibiotic, and cough medicine as needed  Please continue all other medications as before, and refills have been done if requested.  Please have the pharmacy call with any other refills you may need.  Please keep your appointments with your specialists as you may have planned   

## 2018-09-02 NOTE — Progress Notes (Signed)
Subjective:    Patient ID: Skipper Clicheerry L Maiorino, male    DOB: [birth year], 42 y.o.   MRN: [id number]  HPI  Here with acute onset mild to mod 2-3 days ST, HA, general weakness and malaise, with prod cough greenish sputum, but Pt denies chest pain, increased sob or doe, wheezing, orthopnea, PND, increased LE swelling, palpitations, dizziness or syncope.  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  Denies worsening depressive symptoms, suicidal ideation, or panic; has ongoing anxiety, not increased recently.   Pt denies new neurological symptoms such as new headache, or facial or extremity weakness or numbness   Pt denies polydipsia, polyuria  Does have several wks ongoing nasal allergy symptoms with clearish congestion, itch and sneezing, without fever, pain, ST, cough, swelling or wheezing. Past Medical History:  Diagnosis Date  . ALLERGIC RHINITIS 04/26/2010   Qualifier: Diagnosis of  By: Jonny RuizJohn MD, Len BlalockJames W   . ANXIETY 02/22/2011   Qualifier: Diagnosis of  By: Jonny RuizJohn MD, Len BlalockJames W   . COMMON MIGRAINE 04/26/2010   Qualifier: Diagnosis of  By: Jonny RuizJohn MD, Len BlalockJames W   . Hyperlipidemia 05/16/2017  . Morbid obesity (HCC)   . MORBID OBESITY, HX OF 10/14/2007   Qualifier: Diagnosis of  By: Terrilee CroakKnight CMA, Darlene     No past surgical history on file.  reports that he has never smoked. He has never used smokeless tobacco. He reports that he does not drink alcohol or use drugs. family history includes Heart disease in his father; Hypertension in his father; Lung cancer in his father. Allergies  Allergen Reactions  . Shrimp [Shellfish Allergy] Nausea Only   Current Outpatient Medications on File Prior to Visit  Medication Sig Dispense Refill  . ibuprofen (ADVIL,MOTRIN) 200 MG tablet Take 200 mg by mouth every 6 (six) hours as needed for mild pain.    . nitroGLYCERIN (NITRODUR - DOSED IN MG/24 HR) 0.2 mg/hr patch 1/4  patch daily 30 patch 1   No current facility-administered medications on file prior to visit.    Review of Systems  Constitutional: Negative for other unusual diaphoresis or sweats HENT: Negative for ear discharge or swelling Eyes: Negative for other worsening visual disturbances Respiratory: Negative for stridor or other swelling  Gastrointestinal: Negative for worsening distension or other blood Genitourinary: Negative for retention or other urinary change Musculoskeletal: Negative for other MSK pain or swelling Skin: Negative for color change or other new lesions Neurological: Negative for worsening tremors and other numbness  Psychiatric/Behavioral: Negative for worsening agitation or other fatigue All other system neg per pt    Objective:   Physical Exam BP 136/88   Pulse 75   Temp 98.3 F (36.8 C) (Oral)   Ht 5\' 10"  (1.778 m)   Wt (!) 400 lb (181.4 kg)   SpO2 96%   BMI 57.39 kg/m  VS noted, not ill appearing Constitutional: Pt appears in NAD HENT: Head: NCAT.  Right Ear: External ear normal.  Left Ear: External ear normal.  Bilat tm's with mild erythema.  Max sinus areas non tender.  Pharynx with mild erythema, no exudate Eyes: . Pupils are equal, round, and reactive to light. Conjunctivae and EOM are normal Nose: without d/c or deformity Neck: Neck supple. Gross normal ROM Cardiovascular: Normal rate and regular rhythm.   Pulmonary/Chest: Effort normal and breath sounds decreased without rales or wheezing.  Neurological: Pt  is alert. At baseline orientation, motor grossly intact Skin: Skin is warm. No rashes, other new lesions, no LE edema Psychiatric: Pt behavior is normal without agitation  No other     Assessment & Plan:

## 2018-09-02 NOTE — Assessment & Plan Note (Signed)
Ok for [employer] allegra and/or flonase asd

## 2018-09-02 NOTE — Assessment & Plan Note (Signed)
stable overall by history and exam, recent data reviewed with pt, and pt to continue medical treatment as before,  to f/u any worsening symptoms or concerns  

## 2018-10-30 ENCOUNTER — Encounter: Payer: BC Managed Care – PPO | Admitting: Internal Medicine

## 2018-10-30 DIAGNOSIS — Z0289 Encounter for other administrative examinations: Secondary | ICD-10-CM

## 2019-04-29 ENCOUNTER — Telehealth: Payer: Self-pay

## 2019-04-29 DIAGNOSIS — G4710 Hypersomnia, unspecified: Secondary | ICD-10-CM

## 2019-04-29 NOTE — Telephone Encounter (Signed)
Copied from CRM [id number]. Topic: Referral - Request for Referral >> Apr 29, 2019 12:07 PM Jaquita Rector A wrote: Has patient seen PCP for this complaint? Yes.   *If NO, is insurance requiring patient see PCP for this issue before PCP can refer them? Referral for which specialty: Sleep Study  Preferred provider/office: none specified Reason for referral: DOT requirement

## 2019-04-29 NOTE — Addendum Note (Signed)
Addended by: Corwin Levins on: 04/29/2019 04:04 PM   Modules accepted: Orders

## 2019-04-29 NOTE — Telephone Encounter (Signed)
Ok, referral done to pulmonary as they are the ones that order the test and treat if needed

## 2019-05-28 ENCOUNTER — Other Ambulatory Visit: Payer: Self-pay

## 2019-05-28 ENCOUNTER — Ambulatory Visit (INDEPENDENT_AMBULATORY_CARE_PROVIDER_SITE_OTHER): Payer: BC Managed Care – PPO | Admitting: Internal Medicine

## 2019-05-28 ENCOUNTER — Encounter: Payer: Self-pay | Admitting: Internal Medicine

## 2019-05-28 VITALS — BP 126/84 | HR 83 | Temp 98.0°F | Ht 70.5 in | Wt >= 6400 oz

## 2019-05-28 DIAGNOSIS — R0683 Snoring: Secondary | ICD-10-CM | POA: Diagnosis not present

## 2019-05-28 DIAGNOSIS — G4733 Obstructive sleep apnea (adult) (pediatric): Secondary | ICD-10-CM | POA: Diagnosis not present

## 2019-05-28 NOTE — Assessment & Plan Note (Signed)
He minimizes symptoms, but needs sleep study to explore possibility of OSA. Education done, questions answered. Plan- schedule sleep study. Anticipate CPAP if appropriate

## 2019-05-28 NOTE — Progress Notes (Signed)
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 Denies any sleep issues. Lives alone. Medical problem list includes Migraine, Allergic Rhinitis, Morbid obesity, Hyperlipidemia, Cough, Anxiety, Anemia Body weight today 446 lbs Epworth score 5 He denies awarenss of snoing, morning headache, daytime tiredness,  1 cup morning coffee, no sleep meds, no ENT surgery, PFT or Heart condition.  hemorrhoids Past Medical History:  Diagnosis Date  . ALLERGIC RHINITIS 04/26/2010   Qualifier: Diagnosis of  By: Jenny Reichmann MD, Hunt Oris   . ANXIETY 02/22/2011   Qualifier: Diagnosis of  By: Jenny Reichmann MD, [place] 04/26/2010   Qualifier: Diagnosis of  By: Jenny Reichmann MD, Hunt Oris   . Hyperlipidemia 05/16/2017  . Morbid obesity ([place])   . MORBID OBESITY, HX OF 10/14/2007   Qualifier: Diagnosis of  By: Danelle Earthly CMA, Darlene     No past surgical history on file. Family History  Problem Relation Age of Onset  . Lung cancer Father   . Heart disease Father   . Hypertension Father   . Heart disease Mother    Social History   Socioeconomic History  . Marital status: Single    Spouse name: Not on file  . Number of children: Not on file  . Years of education: Not on file  . Highest education level: Not on file  Occupational History  . Not on file  Social Needs  . Financial resource strain: Not on file  . Food insecurity    Worry: Not on file    Inability: Not on file  . Transportation needs    Medical: Not on file    Non-medical: Not on file  Tobacco Use  . Smoking status: Never Smoker  . Smokeless tobacco: Never Used  Substance and Sexual Activity  . Alcohol use: No    Alcohol/week: 0.0 standard drinks  . Drug use: No  . Sexual activity: Not on file  Lifestyle  . Physical activity    Days per week: Not on file    Minutes per session: Not on file  . Stress: Not on file  Relationships  . Social Herbalist on phone: Not on  file    Gets together: Not on file    Attends religious service: Not on file    Active member of club or organization: Not on file    Attends meetings of clubs or organizations: Not on file    Relationship status: Not on file  . Intimate partner violence    Fear of current or ex partner: Not on file    Emotionally abused: Not on file    Physically abused: Not on file    Forced sexual activity: Not on file  Other Topics Concern  . Not on file  Social History Narrative  . Not on file   ROS-see HPI   + = positive Constitutional:    weight loss, night sweats, fevers, chills, fatigue, lassitude. HEENT:    headaches, difficulty swallowing, tooth/dental problems, sore throat,       sneezing, itching, ear ache, nasal congestion, post nasal drip, snoring CV:    chest pain, orthopnea, PND, swelling in lower extremities, anasarca,                                  dizziness, palpitations Resp:   shortness of breath with exertion or  at rest.                productive cough,   non-productive cough, coughing up of blood.              change in color of mucus.  wheezing.   Skin:    rash or lesions. GI:  No-   heartburn, indigestion, abdominal pain, nausea, vomiting, diarrhea,                 change in bowel habits, loss of appetite GU: dysuria, change in color of urine, no urgency or frequency.   flank pain. MS:   joint pain, stiffness, decreased range of motion, back pain. Neuro-     nothing unusual Psych:  change in mood or affect.  depression or anxiety.   memory loss.  OBJ- Physical Exam General- Alert, Oriented, Affect-appropriate, Distress- none acute, + morbidly obese Skin- rash-none, lesions- none, excoriation- none Lymphadenopathy- none Head- atraumatic            Eyes- Gross vision intact, PERRLA, conjunctivae and secretions clear            Ears- Hearing, canals-normal            Nose- Clear, no-Septal dev, mucus, polyps, erosion, perforation             Throat- Mallampati III-IV ,  mucosa clear , drainage- none, tonsils- atrophic Neck- flexible , trachea midline, no stridor , thyroid nl, carotid no bruit Chest - symmetrical excursion , unlabored           Heart/CV- RRR , no murmur , no gallop  , no rub, nl s1 s2                           - JVD- none , edema- none, stasis changes- none, varices- none           Lung- clear to P&A, wheeze- none, cough- none , dullness-none, rub- none           Chest wall-  Abd-  Br/ Gen/ Rectal- Not done, not indicated Extrem- cyanosis- none, clubbing, none, atrophy- none, strength- nl Neuro- grossly intact to observation

## 2019-05-28 NOTE — Patient Instructions (Signed)
Order- schedule sleep study- HST or NPSG, based on insurance guidelines, Dx OSA  Please call me about 2 weeks after your sleep study is done, to see if results and recommendations are available yet. If appropriate, we may be able to start treatment before we see you next.

## 2019-05-28 NOTE — Assessment & Plan Note (Addendum)
He is way too heavy for his frame. Disucussed.

## 2019-06-01 ENCOUNTER — Other Ambulatory Visit: Payer: Self-pay | Admitting: Internal Medicine

## 2019-06-01 DIAGNOSIS — G4733 Obstructive sleep apnea (adult) (pediatric): Secondary | ICD-10-CM

## 2019-06-03 ENCOUNTER — Other Ambulatory Visit (HOSPITAL_COMMUNITY)
Admission: RE | Admit: 2019-06-03 | Discharge: 2019-06-03 | Disposition: A | Discharge status: Home / Self Care | Payer: BC Managed Care – PPO | Source: Ambulatory Visit | Attending: Internal Medicine | Admitting: Internal Medicine

## 2019-06-03 DIAGNOSIS — Z1159 Encounter for screening for other viral diseases: Secondary | ICD-10-CM | POA: Insufficient documentation

## 2019-06-04 LAB — NOVEL CORONAVIRUS, NAA (HOSP ORDER, SEND-OUT TO REF LAB; TAT 18-24 HRS): SARS-CoV-2, NAA: NOT DETECTED

## 2019-06-06 ENCOUNTER — Ambulatory Visit (HOSPITAL_BASED_OUTPATIENT_CLINIC_OR_DEPARTMENT_OTHER): Payer: BC Managed Care – PPO | Attending: Internal Medicine | Admitting: Internal Medicine

## 2019-06-06 ENCOUNTER — Other Ambulatory Visit: Payer: Self-pay

## 2019-06-06 DIAGNOSIS — G4733 Obstructive sleep apnea (adult) (pediatric): Secondary | ICD-10-CM | POA: Diagnosis not present

## 2019-06-08 ENCOUNTER — Other Ambulatory Visit: Payer: Self-pay

## 2019-06-13 DIAGNOSIS — G4733 Obstructive sleep apnea (adult) (pediatric): Secondary | ICD-10-CM

## 2019-06-13 NOTE — Procedures (Signed)
    Patient Name: Francisco Hoffman, [name] Date: 06/06/2019 Gender: Male D.O.B: [birth year] Age (years): 46 Referring Provider: Baird Lyons MD, ABSM Height (inches): 71 Interpreting Physician: Baird Lyons MD, ABSM Weight (lbs): 437 RPSGT: Lanae Boast BMI: 61 MRN: [id number] Neck Size: 16.00  CLINICAL INFORMATION Sleep Study Type: NPSG Indication for sleep study: Obesity Epworth Sleepiness Score: 5  SLEEP STUDY TECHNIQUE As per the AASM Manual for the Scoring of Sleep and Associated Events v2.3 (April 2016) with a hypopnea requiring 4% desaturations.  The channels recorded and monitored were frontal, central and occipital EEG, electrooculogram (EOG), submentalis EMG (chin), nasal and oral airflow, thoracic and abdominal wall motion, anterior tibialis EMG, snore microphone, electrocardiogram, and pulse oximetry.  MEDICATIONS Medications self-administered by patient taken the night of the study : none reported  SLEEP ARCHITECTURE The study was initiated at 10:36:06 PM and ended at 5:09:08 AM.  Sleep onset time was 26.9 minutes and the sleep efficiency was 71.1%%. The total sleep time was 279.5 minutes.  Stage REM latency was 61.5 minutes.  The patient spent 6.1%% of the night in stage N1 sleep, 70.1%% in stage N2 sleep, 1.3%% in stage N3 and 22.5% in REM.  Alpha intrusion was absent.  Supine sleep was 44.90%.  RESPIRATORY PARAMETERS The overall apnea/hypopnea index (AHI) was 1.5 per hour. There were 1 total apneas, including 1 obstructive, 0 central and 0 mixed apneas. There were 6 hypopneas and 3 RERAs.  The AHI during Stage REM sleep was 2.9 per hour.  AHI while supine was 3.3 per hour.  The mean oxygen saturation was 93.5%. The minimum SpO2 during sleep was 86.0%.  soft snoring was noted during this study.  CARDIAC DATA The 2 lead EKG demonstrated sinus rhythm. The mean heart rate was 71.1 beats per minute. Other EKG findings include: None. LEG  MOVEMENT DATA The total PLMS were 0 with a resulting PLMS index of 0.0. Associated arousal with leg movement index was 0.0 .  IMPRESSIONS - No significant obstructive sleep apnea occurred during this study (AHI = 1.5/h). - No significant central sleep apnea occurred during this study (CAI = 0.0/h). - Mild oxygen desaturation was noted during this study (Min O2 = 86.0%). Mean sat 93.5%. Time with sat 88% or less was 6.0 minutes. - The patient snored with soft snoring volume. - No cardiac abnormalities were noted during this study. - Clinically significant periodic limb movements did not occur during sleep. No significant associated arousals.  DIAGNOSIS - Primary snoring  RECOMMENDATIONS - Manage for symptoms and snoring as clincally appropriate. - Be careful with alcohol, sedatives and other CNS depressants that may worsen sleep apnea and disrupt normal sleep architecture. - Sleep hygiene should be reviewed to assess factors that may improve sleep quality. - Weight management and regular exercise should be initiated or continued if appropriate.  [Electronically signed] 06/13/2019 11:22 AM  Baird Lyons MD, ABSM Diplomate, American Board of Sleep Medicine   NPI: [id number]                         [facility]  ELECTRONICALLY SIGNED ON:  06/13/2019, 11:19 AM [place] PH: [phone]   FX: [phone] [place]

## 2019-07-01 ENCOUNTER — Telehealth: Payer: Self-pay | Admitting: Internal Medicine

## 2019-07-01 NOTE — Telephone Encounter (Signed)
Dr. Annamaria Boots please verify results of sleep study negative for sleep apnea? Pt would like to pick up a copy of results for DOT physical tomorrow.

## 2019-07-01 NOTE — Telephone Encounter (Signed)
Sleep study confirmed snoring, but there were only a few apnea events, not enough to make the diagnosis of obstructive sleep apnea. For now, recommend sleep off flat of back and work to lose some weight. We can discuss at office visit if needed.

## 2019-07-01 NOTE — Telephone Encounter (Signed)
Called & spoke w/ pt regarding CY's results. Pt states someone called earlier and told him these results already, however, there is no documentation for said encounter. He stated this individual told him he could come by to pick up his results tomorrow for his DOT physical. I let him know this would be fine. Pt expressed understanding.   Will print this encounter in addition to pt's sleep study from 06/06/2019 and keep it in my look-at for tomorrow. I am routing this message to myself for follow-up once patient is provided with the paperwork he needs.

## 2019-07-02 NOTE — Telephone Encounter (Signed)
Sleep study and telephone encounter printed and placed in the foyer for patient to pick-up. Nothing further needed at this time.

## 2019-09-16 ENCOUNTER — Other Ambulatory Visit: Payer: Self-pay

## 2019-09-16 ENCOUNTER — Encounter: Payer: Self-pay | Admitting: Internal Medicine

## 2019-09-16 ENCOUNTER — Ambulatory Visit (INDEPENDENT_AMBULATORY_CARE_PROVIDER_SITE_OTHER): Payer: BC Managed Care – PPO | Admitting: Internal Medicine

## 2019-09-16 DIAGNOSIS — R0683 Snoring: Secondary | ICD-10-CM | POA: Diagnosis not present

## 2019-09-16 NOTE — Assessment & Plan Note (Signed)
Massively overweight, with obesity particularly in the hips and thighs. Plan- given contact information for Bariatric program.

## 2019-09-16 NOTE — Progress Notes (Signed)
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 Denies any sleep issues. Lives alone. Medical problem list includes Migraine, Allergic Rhinitis, Morbid obesity, Hyperlipidemia, Cough, Anxiety, Anemia Body weight today 446 lbs Epworth score 5 He denies awarenss of snoing, morning headache, daytime tiredness,  1 cup morning coffee, no sleep meds, no ENT surgery, PFT or Heart condition.  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 Sleep study did not show sleep apnea, but he says [place] usually renews his CDL for 2 years, but this time said he would need another sleep study next year, and gave 1 year license renewal. Discussed weight. He blames Covid restrictions keeping him home more, resulting in weight gain.  Declines flu vax.  ROS-see HPI   + = positive Constitutional:    weight loss, night sweats, fevers, chills, fatigue, lassitude. HEENT:    headaches, difficulty swallowing, tooth/dental problems, sore throat,       sneezing, itching, ear ache, nasal congestion, post nasal drip, snoring CV:    chest pain, orthopnea, PND, swelling in lower extremities, anasarca,                                  dizziness, palpitations Resp:   shortness of breath with exertion or at rest.                productive cough,   non-productive cough, coughing up of blood.              change in color of mucus.  wheezing.   Skin:    rash or lesions. GI:  No-   heartburn, indigestion, abdominal pain, nausea, vomiting, diarrhea,                 change in bowel habits, loss of appetite GU: dysuria, change in  color of urine, no urgency or frequency.   flank pain. MS:   joint pain, stiffness, decreased range of motion, back pain. Neuro-     nothing unusual Psych:  change in mood or affect.  depression or anxiety.   memory loss.  OBJ- Physical Exam General- Alert, Oriented, Affect-appropriate, Distress- none acute, + morbidly obese Skin- rash-none, lesions- none, excoriation- none Lymphadenopathy- none Head- atraumatic            Eyes- Gross vision intact, PERRLA, conjunctivae and secretions clear            Ears- Hearing, canals-normal            Nose- Clear, no-Septal dev, mucus, polyps, erosion, perforation             Throat- Mallampati III-IV , mucosa clear , drainage- none, tonsils- atrophic Neck- flexible , trachea midline, no stridor , thyroid nl, carotid no bruit Chest - symmetrical excursion , unlabored           Heart/CV- RRR , no murmur , no gallop  , no rub, nl s1 s2                           -  JVD- none , edema- none, stasis changes- none, varices- none           Lung- clear to P&A, wheeze- none, cough- none , dullness-none, rub- none           Chest wall-  Abd-  Br/ Gen/ Rectal- Not done, not indicated Extrem- cyanosis- none, clubbing, none, atrophy- none, strength- nl Neuro- grossly intact to observation

## 2019-09-16 NOTE — Assessment & Plan Note (Signed)
He does not have sleep apnea. He understands further weight gain may change this. 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

## 2019-09-16 NOTE — Patient Instructions (Addendum)
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  Please call if we can help

## 2020-06-15 ENCOUNTER — Ambulatory Visit: Payer: BC Managed Care – PPO | Admitting: Internal Medicine

## 2024-10-18 ENCOUNTER — Ambulatory Visit (HOSPITAL_COMMUNITY): Payer: Self-pay
